# Patient Record
Sex: Female | Born: 1945 | Race: White | Hispanic: No | Smoking: Former smoker
Health system: Southern US, Community
[De-identification: ages and names within clinical notes are randomized; demographics above are authoritative.]

## PROBLEM LIST (undated history)

## (undated) DIAGNOSIS — B958 Unspecified staphylococcus as the cause of diseases classified elsewhere: Principal | ICD-10-CM

## (undated) DIAGNOSIS — R251 Tremor, unspecified: Secondary | ICD-10-CM

## (undated) DIAGNOSIS — E039 Hypothyroidism, unspecified: Secondary | ICD-10-CM

## (undated) DIAGNOSIS — Z124 Encounter for screening for malignant neoplasm of cervix: Principal | ICD-10-CM

## (undated) DIAGNOSIS — R0600 Dyspnea, unspecified: Principal | ICD-10-CM

## (undated) DIAGNOSIS — J069 Acute upper respiratory infection, unspecified: Secondary | ICD-10-CM

## (undated) DIAGNOSIS — F419 Anxiety disorder, unspecified: Secondary | ICD-10-CM

## (undated) DIAGNOSIS — E079 Disorder of thyroid, unspecified: Secondary | ICD-10-CM

## (undated) DIAGNOSIS — L309 Dermatitis, unspecified: Secondary | ICD-10-CM

## (undated) DIAGNOSIS — R1032 Left lower quadrant pain: Secondary | ICD-10-CM

## (undated) DIAGNOSIS — L578 Other skin changes due to chronic exposure to nonionizing radiation: Secondary | ICD-10-CM

## (undated) DIAGNOSIS — I10 Essential (primary) hypertension: Secondary | ICD-10-CM

## (undated) DIAGNOSIS — H109 Unspecified conjunctivitis: Secondary | ICD-10-CM

## (undated) DIAGNOSIS — Z780 Asymptomatic menopausal state: Secondary | ICD-10-CM

## (undated) DIAGNOSIS — Z0000 Encounter for general adult medical examination without abnormal findings: Secondary | ICD-10-CM

## (undated) DIAGNOSIS — N816 Rectocele: Secondary | ICD-10-CM

## (undated) DIAGNOSIS — E669 Obesity, unspecified: Secondary | ICD-10-CM

## (undated) DIAGNOSIS — F329 Major depressive disorder, single episode, unspecified: Secondary | ICD-10-CM

## (undated) DIAGNOSIS — E785 Hyperlipidemia, unspecified: Secondary | ICD-10-CM

## (undated) HISTORY — DX: Major depressive disorder, single episode, unspecified: F32.9

## (undated) HISTORY — DX: Asymptomatic menopausal state: Z78.0

## (undated) HISTORY — DX: Disorder of thyroid, unspecified: E07.9

## (undated) HISTORY — DX: Encounter for general adult medical examination without abnormal findings: Z00.00

## (undated) HISTORY — DX: Rectocele: N81.6

## (undated) HISTORY — DX: Hypothyroidism, unspecified: E03.9

## (undated) HISTORY — DX: Tremor, unspecified: R25.1

## (undated) HISTORY — DX: Essential (primary) hypertension: I10

## (undated) HISTORY — DX: Acute upper respiratory infection, unspecified: J06.9

## (undated) HISTORY — DX: Left lower quadrant pain: R10.32

## (undated) HISTORY — DX: Dyspnea, unspecified: R06.00

## (undated) HISTORY — DX: Anxiety disorder, unspecified: F41.9

## (undated) HISTORY — DX: Unspecified conjunctivitis: H10.9

## (undated) HISTORY — DX: Obesity, unspecified: E66.9

## (undated) HISTORY — DX: Encounter for screening for malignant neoplasm of cervix: Z12.4

## (undated) HISTORY — DX: Other skin changes due to chronic exposure to nonionizing radiation: L57.8

## (undated) HISTORY — DX: Hyperlipidemia, unspecified: E78.5

## (undated) HISTORY — DX: Unspecified staphylococcus as the cause of diseases classified elsewhere: B95.8

## (undated) HISTORY — DX: Dermatitis, unspecified: L30.9

---

## 1995-07-28 HISTORY — PX: ABDOMINAL HYSTERECTOMY: SHX81

## 1998-07-15 ENCOUNTER — Other Ambulatory Visit: Admission: RE | Admit: 1998-07-15 | Discharge: 1998-07-15 | Payer: Self-pay | Admitting: Obstetrics and Gynecology

## 2000-11-08 ENCOUNTER — Encounter: Payer: Self-pay | Admitting: Obstetrics and Gynecology

## 2000-11-08 ENCOUNTER — Other Ambulatory Visit: Admission: RE | Admit: 2000-11-08 | Discharge: 2000-11-08 | Payer: Self-pay | Admitting: Obstetrics and Gynecology

## 2000-11-08 ENCOUNTER — Encounter: Admission: RE | Admit: 2000-11-08 | Discharge: 2000-11-08 | Payer: Self-pay | Admitting: Obstetrics and Gynecology

## 2002-01-02 ENCOUNTER — Encounter: Admission: RE | Admit: 2002-01-02 | Discharge: 2002-01-02 | Payer: Self-pay | Admitting: Obstetrics and Gynecology

## 2002-01-02 ENCOUNTER — Encounter: Payer: Self-pay | Admitting: Obstetrics and Gynecology

## 2003-01-15 ENCOUNTER — Encounter: Payer: Self-pay | Admitting: Obstetrics and Gynecology

## 2003-01-15 ENCOUNTER — Encounter: Admission: RE | Admit: 2003-01-15 | Discharge: 2003-01-15 | Payer: Self-pay | Admitting: Obstetrics and Gynecology

## 2006-04-27 ENCOUNTER — Ambulatory Visit (HOSPITAL_BASED_OUTPATIENT_CLINIC_OR_DEPARTMENT_OTHER): Admission: RE | Admit: 2006-04-27 | Discharge: 2006-04-27 | Payer: Self-pay | Admitting: Orthopedic Surgery

## 2006-08-23 HISTORY — PX: CARPAL TUNNEL RELEASE: SHX101

## 2006-08-24 ENCOUNTER — Ambulatory Visit (HOSPITAL_BASED_OUTPATIENT_CLINIC_OR_DEPARTMENT_OTHER): Admission: RE | Admit: 2006-08-24 | Discharge: 2006-08-24 | Payer: Self-pay | Admitting: Orthopedic Surgery

## 2006-09-02 ENCOUNTER — Encounter: Admission: RE | Admit: 2006-09-02 | Discharge: 2006-09-02 | Payer: Self-pay | Admitting: Obstetrics and Gynecology

## 2007-09-05 ENCOUNTER — Encounter: Admission: RE | Admit: 2007-09-05 | Discharge: 2007-09-05 | Payer: Self-pay | Admitting: Obstetrics and Gynecology

## 2008-10-24 ENCOUNTER — Encounter: Admission: RE | Admit: 2008-10-24 | Discharge: 2008-10-24 | Payer: Self-pay | Admitting: Obstetrics and Gynecology

## 2009-10-28 ENCOUNTER — Encounter: Admission: RE | Admit: 2009-10-28 | Discharge: 2009-10-28 | Payer: Self-pay | Admitting: Obstetrics and Gynecology

## 2010-11-27 ENCOUNTER — Encounter
Admission: RE | Admit: 2010-11-27 | Discharge: 2010-11-27 | Payer: Self-pay | Source: Home / Self Care | Attending: Obstetrics and Gynecology | Admitting: Obstetrics and Gynecology

## 2010-12-25 ENCOUNTER — Ambulatory Visit (INDEPENDENT_AMBULATORY_CARE_PROVIDER_SITE_OTHER): Payer: MEDICARE | Admitting: Internal Medicine

## 2010-12-25 DIAGNOSIS — E039 Hypothyroidism, unspecified: Secondary | ICD-10-CM

## 2010-12-25 DIAGNOSIS — I10 Essential (primary) hypertension: Secondary | ICD-10-CM

## 2011-03-10 ENCOUNTER — Encounter: Payer: Self-pay | Admitting: Family

## 2011-03-10 ENCOUNTER — Ambulatory Visit (INDEPENDENT_AMBULATORY_CARE_PROVIDER_SITE_OTHER): Payer: Medicare Other | Admitting: Family

## 2011-03-10 DIAGNOSIS — E119 Type 2 diabetes mellitus without complications: Secondary | ICD-10-CM

## 2011-03-10 DIAGNOSIS — I10 Essential (primary) hypertension: Secondary | ICD-10-CM

## 2011-03-10 DIAGNOSIS — Z780 Asymptomatic menopausal state: Secondary | ICD-10-CM

## 2011-03-10 DIAGNOSIS — E785 Hyperlipidemia, unspecified: Secondary | ICD-10-CM

## 2011-03-10 DIAGNOSIS — J329 Chronic sinusitis, unspecified: Secondary | ICD-10-CM

## 2011-03-10 DIAGNOSIS — N951 Menopausal and female climacteric states: Secondary | ICD-10-CM

## 2011-03-10 MED ORDER — CEFUROXIME AXETIL 500 MG PO TABS: 500.0000 mg | ORAL_TABLET | Freq: Two times a day (BID) | ORAL | Status: AC

## 2011-03-10 NOTE — Progress Notes (Signed)
Subjective:    Patient ID: Crystal Hodge, female    DOB: 1946-05-24, 64 y.o.   MRN: [id number]  HPI  Ms.  Tuff is a 65 yr old female who presents today to establish care.  She presents with complaint of yellow/green nasal congestion which stared 4 days ago.  She has had a fever as high as 100.7.  Has been sleeping more.  Denies facial pain.  + associated cough.  Thick green sputum.   DM2- This is being managed by Dr. Lucianne Muss.  Pt reports that her diabetes has been well controlled.  Trying to lose weight.  HTN- This has been stable.   High Cholesterol- takes lipitor.  Denies associated muscle pain.  Review of Systems  Constitutional: Positive for fever. Negative for unexpected weight change.  HENT: Positive for sore throat and postnasal drip. Negative for ear pain and facial swelling.        Mild sore throat. Mouth is dry.  Eyes: Negative for itching.  Respiratory: Negative for shortness of breath and wheezing.   Cardiovascular: Negative for chest pain and leg swelling.  Gastrointestinal: Positive for nausea. Negative for vomiting and diarrhea.       Notes + nausea in AM's which she associates with medication.  Genitourinary: Negative for dysuria, urgency and frequency.  Musculoskeletal:       Occasional knee pain- seen by Dr. Despina Hick  Skin: Negative for rash.  Neurological: Negative for weakness and numbness.  Hematological: Negative for adenopathy.   Past Medical History  Diagnosis Date  . Diabetes mellitus     type II  . Hypertension   . Hyperlipidemia   . Thyroid disease     History   Social History  . Marital Status: Widowed    Spouse Name: N/A    Number of Children: 2  . Years of Education: N/A   Occupational History  . Retired    Social History Main Topics  . Smoking status: Former Smoker    Quit date: 11/23/1990  . Smokeless tobacco: Never Used  . Alcohol Use: Yes     occasional  . Drug Use: Not on file  . Sexually Active: Not on file   Other Topics  Concern  . Not on file   Social History Narrative   Caffeine: 1 mug coffee dailyRegular exercise: plays tennis.    Past Surgical History  Procedure Date  . Abdominal hysterectomy 07/28/95    Family History  Problem Relation Age of Onset  . Hyperlipidemia Mother   . Hyperlipidemia Father   . Heart disease Father     triple bypass  . Diabetes Son     ?type I    Allergies  Allergen Reactions  . Amoxicillin Diarrhea    No current outpatient prescriptions on file prior to visit.    BP 122/76  Pulse 66  Temp(Src) 98.4 F (36.9 C) (Oral)  Resp 18  Ht 5' 10.5" (1.791 m)  Wt 273 lb 1.3 oz (123.868 kg)  BMI 38.63 kg/m2       Objective:   Physical Exam  Constitutional: She is oriented to person, place, and time. She appears well-developed and well-nourished.  HENT:  Head: Normocephalic and atraumatic.  Right Ear: Tympanic membrane and ear canal normal.  Left Ear: Tympanic membrane and ear canal normal.  Mouth/Throat: Uvula is midline, oropharynx is clear and moist and mucous membranes are normal. No oropharyngeal exudate.  Eyes: Conjunctivae are normal. Pupils are equal, round, and reactive to light.  Neck: Normal  range of motion. Neck supple. No thyromegaly present.  Cardiovascular: Normal rate and regular rhythm.   Pulmonary/Chest: Effort normal and breath sounds normal.  Abdominal: Soft. She exhibits no distension. There is no tenderness.  Musculoskeletal: She exhibits no edema.  Lymphadenopathy:    She has no cervical adenopathy.  Neurological: She is alert and oriented to person, place, and time.  Skin: Skin is warm and dry. No rash noted.  Psychiatric: She has a normal mood and affect. Her behavior is normal. Judgment normal.          Assessment & Plan:

## 2011-03-10 NOTE — Patient Instructions (Signed)
Please call if your symptoms worsen or if they do not improve in 48-72 hours.

## 2011-03-11 DIAGNOSIS — I10 Essential (primary) hypertension: Secondary | ICD-10-CM | POA: Insufficient documentation

## 2011-03-11 DIAGNOSIS — E669 Obesity, unspecified: Secondary | ICD-10-CM | POA: Insufficient documentation

## 2011-03-11 DIAGNOSIS — J329 Chronic sinusitis, unspecified: Secondary | ICD-10-CM | POA: Insufficient documentation

## 2011-03-11 DIAGNOSIS — E1169 Type 2 diabetes mellitus with other specified complication: Secondary | ICD-10-CM | POA: Insufficient documentation

## 2011-03-11 DIAGNOSIS — E785 Hyperlipidemia, unspecified: Secondary | ICD-10-CM | POA: Insufficient documentation

## 2011-03-11 DIAGNOSIS — N951 Menopausal and female climacteric states: Secondary | ICD-10-CM | POA: Insufficient documentation

## 2011-03-11 NOTE — Assessment & Plan Note (Signed)
Symptoms consistent with sinusitis.  Will plan to treat with ceftin.

## 2011-03-11 NOTE — Assessment & Plan Note (Signed)
Appears stable 

## 2011-03-11 NOTE — Assessment & Plan Note (Signed)
She reports that this has been controlled and is being managed by Dr. Lucianne Muss.

## 2011-03-11 NOTE — Assessment & Plan Note (Signed)
She has followed with Dr. Rosalio Macadamia who has prescribed bioidentical hormones.  She is requesting a refill.  A hand written rx has been provided for 1 month supply +11 refills at pt's request.

## 2011-03-13 ENCOUNTER — Telehealth: Payer: Self-pay | Admitting: *Deleted

## 2011-03-13 NOTE — Telephone Encounter (Signed)
Pt states she has taken her antibiotic since Tuesday but doesn't feel any better; not feeling worse. Still having intermittent fever around 99.5. Advised pt per Melissa's instruction to give it one more day. If not feeling better tomorrow, go to the [facility]. Advised pt if symptoms worsen or difficulty breathing to go to the ER. Pt voices understanding.

## 2011-04-01 ENCOUNTER — Telehealth: Payer: Self-pay | Admitting: *Deleted

## 2011-04-01 NOTE — Telephone Encounter (Signed)
Records received from Dr Lonell Face office and forwarded to provider for review.

## 2011-04-02 ENCOUNTER — Telehealth: Payer: Self-pay | Admitting: Family

## 2011-04-02 NOTE — Telephone Encounter (Signed)
Left message on pt's voice mail to return my call

## 2011-04-02 NOTE — Telephone Encounter (Signed)
CEFTIN 500 MG TABLET 1 BY MOUTH TWICE DAILY LAST FILL 03-10-2011

## 2011-04-03 ENCOUNTER — Encounter: Payer: Self-pay | Admitting: Internal Medicine

## 2011-04-03 NOTE — Telephone Encounter (Signed)
Left message for pt to return my call.

## 2011-04-06 NOTE — Telephone Encounter (Signed)
Spoke to pt and she states she did not request a refill on Ceftin however she does not feel like her cough has improved. Feels like it may be worse, sometimes feels difficult to take a deep breath. Pt scheduled appt for tomorrow at 2:30, declines earlier appt.

## 2011-04-07 ENCOUNTER — Ambulatory Visit (HOSPITAL_BASED_OUTPATIENT_CLINIC_OR_DEPARTMENT_OTHER)
Admission: RE | Admit: 2011-04-07 | Discharge: 2011-04-07 | Disposition: A | Discharge status: Home / Self Care | Payer: Medicare Other | Source: Ambulatory Visit | Attending: Family | Admitting: Family

## 2011-04-07 ENCOUNTER — Ambulatory Visit (INDEPENDENT_AMBULATORY_CARE_PROVIDER_SITE_OTHER): Payer: Medicare Other | Admitting: Family

## 2011-04-07 ENCOUNTER — Encounter: Payer: Self-pay | Admitting: Family

## 2011-04-07 ENCOUNTER — Telehealth: Payer: Self-pay | Admitting: Family

## 2011-04-07 VITALS — BP 126/82 | HR 78 | Temp 98.3°F | Resp 18 | Ht 70.5 in

## 2011-04-07 DIAGNOSIS — R259 Unspecified abnormal involuntary movements: Secondary | ICD-10-CM

## 2011-04-07 DIAGNOSIS — R05 Cough: Secondary | ICD-10-CM

## 2011-04-07 DIAGNOSIS — R059 Cough, unspecified: Secondary | ICD-10-CM

## 2011-04-07 DIAGNOSIS — R509 Fever, unspecified: Secondary | ICD-10-CM

## 2011-04-07 DIAGNOSIS — R0789 Other chest pain: Secondary | ICD-10-CM

## 2011-04-07 DIAGNOSIS — R079 Chest pain, unspecified: Secondary | ICD-10-CM | POA: Insufficient documentation

## 2011-04-07 DIAGNOSIS — J40 Bronchitis, not specified as acute or chronic: Secondary | ICD-10-CM

## 2011-04-07 DIAGNOSIS — R251 Tremor, unspecified: Secondary | ICD-10-CM | POA: Insufficient documentation

## 2011-04-07 DIAGNOSIS — R0989 Other specified symptoms and signs involving the circulatory and respiratory systems: Secondary | ICD-10-CM

## 2011-04-07 HISTORY — DX: Tremor, unspecified: R25.1

## 2011-04-07 MED ORDER — BENZONATATE 100 MG PO CAPS: 100.0000 mg | ORAL_CAPSULE | Freq: Four times a day (QID) | ORAL | Status: DC | PRN

## 2011-04-07 MED ORDER — AZITHROMYCIN 250 MG PO TABS: ORAL_TABLET | ORAL | Status: AC

## 2011-04-07 NOTE — Telephone Encounter (Signed)
Called pt, reviewed x-ray results.  Will plan to treat for bronchitis with tessalon and zithromax. Pt verbalizes understanding.

## 2011-04-07 NOTE — Patient Instructions (Signed)
Please complete your chest x-ray on the first floor.  We will call you with results.

## 2011-04-07 NOTE — Assessment & Plan Note (Signed)
cxr performed today- negative for pneumonia.  Will plan to treat for bronchitis with zithromax and add tessalon PRN cough.

## 2011-04-07 NOTE — Assessment & Plan Note (Addendum)
Will refer to neurology for further evaluation.  Will also ask them to evaluate her HS tongue biting.  Nocturnal seizures are in the differential.

## 2011-04-07 NOTE — Progress Notes (Signed)
Subjective:    Patient ID: Crystal Hodge, female    DOB: 1946/06/11, 65 y.o.   MRN: [id number]  HPI  Crystal Hodge is a 65 yr old female who presents today with chief complaint of cough.   1. Cough-Crystal Hodge has had a low grade temp 99.8.  Cough is productive at times in the AM- but has become more dry.  Has a chest "heaviness."  Denies associated ear pain, sore throat, or nasal congestion.    2. Tongue Biting- Crystal Hodge reports that Crystal Hodge has woken up several times with severe bites on her tongue which Crystal Hodge does not remember doing.  Crystal Hodge sleeps alone. Denies associated urinary incontinence.  3. Head tremor- Crystal Hodge reports that her friends have been commenting on her "bobble head."  Crystal Hodge reports that this has been happening for over a year.  Crystal Hodge notes that on several occasions Crystal Hodge has had a right hand tremor which has made it difficult for her to hold things such as a computer mouse.  Review of Systems  See HPI  Past Medical History  Diagnosis Date  . Diabetes mellitus     type II  . Hypertension   . Hyperlipidemia   . Thyroid disease   . Hypothyroidism   . Rectocele   . Menopause   . Obesity     History   Social History  . Marital Status: Widowed    Spouse Name: N/A    Number of Children: 2  . Years of Education: HS   Occupational History  . Retired    Social History Main Topics  . Smoking status: Former Smoker    Quit date: 11/23/1990  . Smokeless tobacco: Never Used  . Alcohol Use: Yes     occasional  . Drug Use: No  . Sexually Active: Not Currently -- Female partner(s)   Other Topics Concern  . Not on file   Social History Narrative   Caffeine: 1 mug coffee dailyRegular exercise: plays tennis.    Past Surgical History  Procedure Date  . Abdominal hysterectomy 07/28/95  . Carpal tunnel release 10/07    Right    Family History  Problem Relation Age of Onset  . Hyperlipidemia Mother   . Hyperlipidemia Father   . Heart disease Father     triple bypass  . Diabetes Son    ?type I    Allergies  Allergen Reactions  . Amoxicillin Diarrhea    Current Outpatient Prescriptions on File Prior to Visit  Medication Sig Dispense Refill  . aspirin 81 MG tablet Take 81 mg by mouth every morning.        Marland Kitchen atorvastatin (LIPITOR) 40 MG tablet Take 40 mg by mouth every evening.        . benazepril (LOTENSIN) 10 MG tablet Take 10 mg by mouth daily.        . Coenzyme Q10 (CO Q 10) 10 MG CAPS Take 100 mg by mouth every morning.        . docusate sodium (COLACE) 100 MG capsule Take 100 mg by mouth 2 (two) times daily.        . fenofibrate (TRICOR) 145 MG tablet Not sure of strength.       . Fiber CAPS Take 1 capsule by mouth every morning.        Marland Kitchen glimepiride (AMARYL) 1 MG tablet Take 1 mg by mouth daily before supper.        . hydrochlorothiazide 25 MG tablet Take 1/2 tablet by mouth  every morning.       Marland Kitchen levothyroxine (SYNTHROID, LEVOTHROID) 100 MCG tablet Take 100 mcg by mouth daily.        . NON FORMULARY Apply 0.5-1 mLs topically daily. BIEST 6/4 (estriol/estradiol) / Progesterone/Testosterone HRT (10mL) 1-40-1mg /mL cream.       . NON FORMULARY 1 tsp honey with Manuka every morning.       . sitaGLIPtan-metformin (JANUMET) 50-1000 MG per tablet Take 1 tablet by mouth 2 (two) times daily with a meal.          BP 126/82  Pulse 78  Temp(Src) 98.3 F (36.8 C) (Oral)  Resp 18  Ht 5' 10.5" (1.791 m)  SpO2 97%       Objective:   Physical Exam  Constitutional: Crystal Hodge appears well-developed and well-nourished. No distress.  HENT:  Head: Normocephalic.  Right Ear: Tympanic membrane normal.  Left Ear: Tympanic membrane normal.  Eyes: Conjunctivae are normal. Pupils are equal, round, and reactive to light.  Neck: Normal range of motion. Neck supple.  Cardiovascular: Normal rate and regular rhythm.   Pulmonary/Chest: Effort normal and breath sounds normal. No respiratory distress.  Skin: Crystal Hodge is not diaphoretic.  Psychiatric: Crystal Hodge has a normal mood and affect. Her  behavior is normal. Judgment and thought content normal.  Neuro: subtle head tremor is noted.  ? Subtle voice tremor.         Assessment & Plan:

## 2011-04-10 NOTE — Op Note (Signed)
NAMECHARLEY, Crystal Hodge                 ACCOUNT NO.:  [id number]   MEDICAL RECORD NO.:  [id number]          PATIENT TYPE:  AMB   LOCATION:  DSC                          FACILITY:  [facility]   PHYSICIAN:  Katy Fitch. Sypher, M.D. DATE OF BIRTH:  02-21-1946   DATE OF PROCEDURE:  04/27/2006  DATE OF DISCHARGE:                                 OPERATIVE REPORT   PREOPERATIVE DIAGNOSIS:  1.  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  2.  Left carpal tunnel syndrome, both documented by electrodiagnostics      studies in October 2006.   POSTOPERATIVE DIAGNOSIS:  1.  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  2.  Left carpal tunnel syndrome, both documented by electrodiagnostics      studies in October 2006.   OPERATION:  1.  Decompression of left ulnar nerve at the cubital tunnel with resection      of the medial triceps tendon and neurolysis 6 cm proximal and 6 cm      distal to epicondyle.  2.  Decompression of left carpal canal by release of left transcarpal      ligament.   OPERATIONS:  Katy Fitch. Sypher, M.D.   ASSISTANT:  Marveen Reeks. Dasnoit, P.A.-C.   ANESTHESIA:  General by LMA.   SUPERVISING ANESTHESIOLOGIST:  Janetta Hora. Gelene Mink, M.D.   INDICATIONS:  Crystal Hodge is a 59-year woman referred through the courtesy  of Dr. Marjory Lies of [facility] for evaluation of left  hand weakness and numbness.  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  She  was developing weakness of her left hand and numbness in the ulnar  innervated fingers.  She had nocturnal numbness in her innervated fingers  bilaterally.  Clinical examination revealed borderline diabetes.  Electrodiagnostic completed by Dr. Johna Roles confirm bilateral carpal tunnel  syndrome and a severe left ulnar neuropathy.  I advised her in October 2006  to proceed with decompression of her ulnar nerve  and probable carpal tunnel  release.  She elected to postpone the surgery for more than six months  hoping that her symptoms would improve.  They did not and rather became more  troublesome.  She presents for evaluation and management at this time.   PROCEDURE:  Hannia Collet was brought to the operating room and placed in  supine position on the operating room table. Following the induction of  general anesthesia by LMA technique, the left arm was prepped with Betadine  soap solution and sterilely draped.  A pneumatic tourniquet was placed on  the proximal brachium.  Following exsanguination of the left arm with an  Esmarch bandage, the arterial tourniquet was inflated to 250 mmHg.  The  procedure commenced with a short incision in the line of the ring finger in  the palm.  The subcutaneous tissues were carefully divided in the palmar  fascia.  This was split longitudinally to the common branch of the median  nerve and superficial palmar arch.  The  median nerve was gently isolated  from the transcarpal ligament followed by the use of scissors to release the  transverse carpal ligament along its ulnar border extending it into the  distal forearm.  This widely opened the carpal canal.  The ulnar bursa was  noted be fibrotic.  No mass or other predicaments were noted.  The wound was  then inspected for bleeding points which were electrocauterized with bipolar  current followed by repair of the skin with intradermal 3-0 Prolene suture.   Attention was directed to the medial left elbow.  The landmarks were  palpated and a marking pen used to design an incision posterior to the  medial condyle.  The skin was taken sharply followed by careful dissection  of the subcutaneous tissues take care to gently retract the posterior branch  of the medial antebrachial cutaneous nerve.  The fascia overlying the ulnar  nerve was incised posterior to the medial condyle and a thick cuff of  fibrotic tissue  released from the medial head of the triceps.  The nerve was  severely compressed proximal to the cubital tunnel.  A complete neurolysis  of the nerve was accomplished 6 cm above the medial epicondyle followed by  release of the fascia at the head of the flexor carpi ulnaris and resection  of the arcuate ligament.  A band of the triceps tendon measuring 1 cm in  width and length of approximately 2 cm was resected fully decompressing the  ulnar nerve at the cubital tunnel.  The wound was inspected for bleeding  points followed by inspection of the nerve through a range of motion 0 to  140 degrees.  The nerve was stable due to an intact mesoneurium.  Bleeding  points were cauterized with bipolar current followed by repair of the skin  with intradermal 3-0 Prolene suture.  A compressive dressing was applied  with sterile gauze, Tegaderm and Ace wrap.  Crystal Hodge' wrist was immobilized  with a volar plaster splint maintaining the wrist in 5 degrees of  dorsiflexion.      Katy Fitch Sypher, M.D.  Electronically Signed     RVS/MEDQ  D:  04/27/2006  T:  04/27/2006  Job:  [id number]   cc:   Marjory Lies, M.D.  Fax: [phone]

## 2011-04-10 NOTE — Op Note (Signed)
NAMESAMERA, MACY                 ACCOUNT NO.:  [id number]   MEDICAL RECORD NO.:  [id number]          PATIENT TYPE:  AMB   LOCATION:  DSC                          FACILITY:  [facility]   PHYSICIAN:  Katy Fitch. Sypher, M.D. DATE OF BIRTH:  08-13-1946   DATE OF PROCEDURE:  08/24/2006  DATE OF DISCHARGE:                                 OPERATIVE REPORT   PREOPERATIVE DIAGNOSIS:  Chronic entrapment neuropathy, right median nerve  at carpal tunnel.   POSTOPERATIVE DIAGNOSIS:  Chronic entrapment neuropathy, right median nerve  at carpal tunnel.   OPERATIONS:  Release of right transverse carpal ligament.   OPERATING SURGEON:  Josephine Igo, M.D.   ASSISTANT:  Annye Rusk, PA-C.   ANESTHESIA:  General by LMA, supervising anesthesiologist is Dr. Krista Blue.   INDICATIONS:  Crystal Hodge a 65 year old woman referred through the courtesy  of Dr. Marjory Lies of [facility] for evaluation and  management of numb right hand.   Clinical examination revealed signs of carpal tunnel syndrome.  Electrodiagnostic studies confirmed right median neuropathy.   Due to failure to respond to nonoperative measures, she is brought to the  operating at this time for release of right transverse carpal ligament.   PROCEDURE:  Crystal Hodge is brought to the operating room and placed in  supine position on the operating table.   Following the induction of general anesthesia by LMA technique, the right  arm was prepped with Betadine soap solution, sterilely draped.  A pneumatic  tourniquet was applied to the proximal right brachium.   Following exsanguination of the right arm with Esmarch bandage, the arterial  tourniquet was inflated to 220 mmHg.   The procedure commenced with a short incision in the line of the ring finger  in the palm.  The subcutaneous tissues were carefully divided revealing the  palmar fascia.  This was split longitudinally to reveal the common sensory  branch of the  median nerve.  The median nerve proper was identified, gently  isolated from the transverse carpal ligament and following creation of a  path with a [place] 4 elevator, the ulnar aspect of transverse carpal  ligament released subcutaneously into the distal forearm with scissors.   This widely opened carpal canal.   The median nerve was noted to be invested in fibrotic tenosynovium.  There  were no signs of mass or other anatomic predicament.   Bleeding points along the margin released ligament were electrocauterized  with bipolar current followed by repair of the skin with intradermal 3-0  Prolene suture.   A compressive dressing was applied with volar plaster splint maintaining the  wrist in 5 degrees dorsiflexion.   2% lidocaine was obtained along the wound margins for postoperative  analgesia.   For aftercare [name] was provided prescription for Percocet 5 mg 1 tablet  p.o. 4 to 6 hours p.r.n. pain 20 tablets without refill.      Katy Fitch Sypher, M.D.  Electronically Signed     RVS/MEDQ  D:  08/24/2006  T:  08/25/2006  Job:  [id number]   cc:   Kipp Brood  Doristine Counter, M.D.

## 2011-05-12 ENCOUNTER — Ambulatory Visit: Payer: MEDICARE | Admitting: Internal Medicine

## 2011-05-14 ENCOUNTER — Encounter: Payer: Self-pay | Admitting: Internal Medicine

## 2011-05-14 ENCOUNTER — Telehealth: Payer: Self-pay | Admitting: *Deleted

## 2011-05-14 ENCOUNTER — Ambulatory Visit (INDEPENDENT_AMBULATORY_CARE_PROVIDER_SITE_OTHER): Payer: Medicare Other | Admitting: Internal Medicine

## 2011-05-14 VITALS — BP 124/80 | Temp 98.6°F | Wt 244.0 lb

## 2011-05-14 DIAGNOSIS — R197 Diarrhea, unspecified: Secondary | ICD-10-CM

## 2011-05-14 MED ORDER — CIPROFLOXACIN HCL 500 MG PO TABS: 500.0000 mg | ORAL_TABLET | Freq: Two times a day (BID) | ORAL | Status: AC

## 2011-05-14 MED ORDER — DIPHENOXYLATE-ATROPINE 2.5-0.025 MG PO TABS: 1.0000 | ORAL_TABLET | Freq: Four times a day (QID) | ORAL | Status: DC | PRN

## 2011-05-14 NOTE — Telephone Encounter (Signed)
So if the diarrhea just started I would not treat with antibiotics or really without seeing the patient. We would of course be happy to see at [facility] to check her vitals and some lab work. For less than 24 hour of diarrhea without fever, blood or syncope would recommend clear liquid diet for 24 hours, make some of those beverages electrolyte substitutes such as Gatorade or Pedialyte and Ginger Ale can really help nausea. When she feels like eating she should start back with the BRAT diet (bananas, rice, applesauce, toast) or similar foods such as plain pasta, rice etc. Then progress diet as tolerated over 24 hours. If not improving should be seen on Friday. If worse, severe pain or other symptoms develop she needs to seek immediate medical care

## 2011-05-14 NOTE — Telephone Encounter (Signed)
Call placed to patient at [phone], she was advised per Dr. Abner Greenspan instructions. Patient states that she has had diarrhea an additional 7 times since her call this am, and feels she will need to be seen. She states she has scheduled a appointment for 1:15 pm today

## 2011-05-14 NOTE — Telephone Encounter (Signed)
Patient called and left voice message stating she is having a lot of "runs", and has been up and down all night. She c/o increased amount of belching.  Call returned to patient at [phone], she states that she is having watery stools brown in color that started Monday night, yesterday she thought she was getting better, but started again last night. She denies vomiting, but she is having severe nausea. She denies temperature, and does not feel feverish. She states she had a similar incident and was advised she needed to be treated with antibiotics. She states since last night she has a minimum of 10 bowels movements. No self care measures have been taken by patient

## 2011-05-15 ENCOUNTER — Telehealth: Payer: Self-pay | Admitting: Internal Medicine

## 2011-05-15 DIAGNOSIS — R197 Diarrhea, unspecified: Secondary | ICD-10-CM | POA: Insufficient documentation

## 2011-05-15 LAB — CBC WITH DIFFERENTIAL/PLATELET
Eosinophils Relative: 4 % (ref 0–5)
HCT: 43.7 % (ref 36.0–46.0)
Hemoglobin: 13.4 g/dL (ref 12.0–15.0)
Lymphocytes Relative: 17 % (ref 12–46)
Lymphs Abs: 1.9 10*3/uL (ref 0.7–4.0)
MCV: 80.8 fL (ref 78.0–100.0)
Monocytes Absolute: 0.7 10*3/uL (ref 0.1–1.0)
Monocytes Relative: 6 % (ref 3–12)
Neutro Abs: 7.9 10*3/uL — ABNORMAL HIGH (ref 1.7–7.7)
WBC: 11 10*3/uL — ABNORMAL HIGH (ref 4.0–10.5)

## 2011-05-15 LAB — BASIC METABOLIC PANEL
BUN: 22 mg/dL (ref 6–23)
CO2: 23 mEq/L (ref 19–32)
Chloride: 101 mEq/L (ref 96–112)
Glucose, Bld: 128 mg/dL — ABNORMAL HIGH (ref 70–99)
Potassium: 4.4 mEq/L (ref 3.5–5.3)

## 2011-05-15 NOTE — Telephone Encounter (Signed)
Pt would like lab results from yesterday. 

## 2011-05-15 NOTE — Progress Notes (Signed)
  Subjective:    Patient ID: Crystal Hodge, female    DOB: [birth year], 65 y.o.   MRN: [id number]  HPI Pt presents to clinic for evaluation of diarrhea. Notes 4d h/o severe diarrhea with over 8 loose watery brown stools. Denies blood in stool, n/v, abdominal pain or sick exposures.  Attempted otc medication without significant improvement. Attempting to increase po fluid intake but takes diuretic. No dizziness, presyncope or syncope. Hemodynamically stable. Sx's initially improved but last 24 hours have worsened. No other alleviating or exacerbating factors. No other complaints.  Reviewed pmh, medications and allergies.    Review of Systems see HPI     Objective:   Physical Exam  Nursing note and vitals reviewed. Constitutional: She appears well-developed and well-nourished. No distress.  HENT:  Head: Normocephalic and atraumatic.  Right Ear: External ear normal.  Left Ear: External ear normal.  Nose: Nose normal.  Eyes: Conjunctivae are normal. No scleral icterus.  Neck: Neck supple.  Abdominal: Soft. Bowel sounds are normal. She exhibits no distension and no mass. There is no tenderness. There is no rebound and no guarding.  Neurological: She is alert.  Skin: Skin is warm and dry. No rash noted. She is not diaphoretic. No erythema.  Psychiatric: She has a normal mood and affect.          Assessment & Plan:

## 2011-05-18 NOTE — Telephone Encounter (Signed)
Patient called and left voice message stating she was returning the phone call,and that she was at home and could be reached at [phone]. Call was returned to patient at [phone], she was informed of lab results per Dr. Rodena Medin instruction

## 2011-05-18 NOTE — Telephone Encounter (Signed)
pls notify chem7 nl. Slightly high wbc but should be on abx   Call placed to patient at [phone], no answer. A voice message was left for patient to return call regarding lab results. (antibiotic has been prescribed)

## 2011-08-15 ENCOUNTER — Ambulatory Visit (INDEPENDENT_AMBULATORY_CARE_PROVIDER_SITE_OTHER): Payer: Medicare Other | Admitting: Family Medicine

## 2011-08-15 ENCOUNTER — Encounter: Payer: Self-pay | Admitting: Family Medicine

## 2011-08-15 VITALS — BP 112/72 | Temp 100.5°F

## 2011-08-15 DIAGNOSIS — J40 Bronchitis, not specified as acute or chronic: Secondary | ICD-10-CM

## 2011-08-15 MED ORDER — HYDROCODONE-HOMATROPINE 5-1.5 MG/5ML PO SYRP: 5.0000 mL | ORAL_SOLUTION | ORAL | Status: AC | PRN

## 2011-08-15 MED ORDER — AZITHROMYCIN 250 MG PO TABS: ORAL_TABLET | ORAL | Status: DC

## 2011-08-15 NOTE — Progress Notes (Signed)
  Subjective:    Patient ID: Crystal Hodge, female    DOB: [birth year], 65 y.o.   MRN: [id number]  HPI Here for 2 days of chest tightness, fever, and coughing up green sputum. Drinking fluids   Review of Systems  Constitutional: Negative.   HENT: Negative.   Eyes: Negative.   Respiratory: Positive for cough and wheezing.        Objective:   Physical Exam  Constitutional: She appears well-developed and well-nourished.  HENT:  Right Ear: External ear normal.  Left Ear: External ear normal.  Nose: Nose normal.  Mouth/Throat: Oropharynx is clear and moist. No oropharyngeal exudate.  Eyes: Conjunctivae are normal. Pupils are equal, round, and reactive to light.  Neck: Neck supple. No thyromegaly present.  Pulmonary/Chest: Effort normal. No respiratory distress. She has no wheezes. She has no rales.       Scattered rhonchi   Lymphadenopathy:    She has no cervical adenopathy.          Assessment & Plan:   recheck prn

## 2011-08-20 ENCOUNTER — Ambulatory Visit (INDEPENDENT_AMBULATORY_CARE_PROVIDER_SITE_OTHER): Payer: Medicare Other | Admitting: Internal Medicine

## 2011-08-20 ENCOUNTER — Encounter: Payer: Self-pay | Admitting: Internal Medicine

## 2011-08-20 ENCOUNTER — Ambulatory Visit (HOSPITAL_BASED_OUTPATIENT_CLINIC_OR_DEPARTMENT_OTHER)
Admission: RE | Admit: 2011-08-20 | Discharge: 2011-08-20 | Disposition: A | Discharge status: Home / Self Care | Payer: Medicare Other | Source: Ambulatory Visit | Attending: Internal Medicine | Admitting: Internal Medicine

## 2011-08-20 VITALS — BP 110/70 | HR 68 | Temp 98.4°F | Resp 20 | Ht 70.5 in

## 2011-08-20 DIAGNOSIS — R059 Cough, unspecified: Secondary | ICD-10-CM

## 2011-08-20 DIAGNOSIS — R05 Cough: Secondary | ICD-10-CM

## 2011-08-20 DIAGNOSIS — E119 Type 2 diabetes mellitus without complications: Secondary | ICD-10-CM

## 2011-08-20 MED ORDER — METHYLPREDNISOLONE ACETATE 40 MG/ML IJ SUSP
40.0000 mg | Freq: Once | INTRAMUSCULAR | Status: AC
  Administered 2011-08-20: 40 mg via INTRAMUSCULAR

## 2011-08-20 MED ORDER — LEVOFLOXACIN 500 MG PO TABS: 500.0000 mg | ORAL_TABLET | Freq: Every day | ORAL | Status: AC

## 2011-08-20 NOTE — Assessment & Plan Note (Signed)
Obtain cxr pa/lat. Begin levaquin course. depomedrol 40mg  im given. Followup if no improvement or worsening.

## 2011-08-20 NOTE — Progress Notes (Signed)
  Subjective:    Patient ID: Crystal Hodge, female    DOB: February 25, 1946, 65 y.o.   MRN: [id number]  HPI Pt presents to clinic for evaluation of cough. Notes 7-8 day h/o NP cough, chest and head congestion with initial fever tmax 101.5 last week now resolved. Seen 9/22 by Dr. Clent Ridges and given zpak and hydromet. Completed abx and initially improved but now worsened. No dyspnea or wheezing. Cough worse at night. No other alleviating or exacerbating factors. Has dm and states is only checking fsbs periodically. No other complaints.  Past Medical History  Diagnosis Date  . Diabetes mellitus     type II  . Hypertension   . Hyperlipidemia   . Thyroid disease   . Hypothyroidism   . Rectocele   . Menopause   . Obesity   . Tremor 04/07/2011   Past Surgical History  Procedure Date  . Abdominal hysterectomy 07/28/95  . Carpal tunnel release 10/07    Right    reports that she quit smoking about 20 years ago. She has never used smokeless tobacco. She reports that she drinks alcohol. She reports that she does not use illicit drugs. family history includes Diabetes in her son; Heart disease in her father; and Hyperlipidemia in her father and mother. Allergies  Allergen Reactions  . Amoxicillin Diarrhea       Review of Systems see hpi     Objective:   Physical Exam  Nursing note and vitals reviewed. Constitutional: She appears well-developed and well-nourished.  HENT:  Head: Normocephalic and atraumatic.  Right Ear: Tympanic membrane, external ear and ear canal normal.  Left Ear: Tympanic membrane, external ear and ear canal normal.  Nose: Nose normal.  Mouth/Throat: Uvula is midline, oropharynx is clear and moist and mucous membranes are normal. No oropharyngeal exudate.  Eyes: Conjunctivae are normal. Right eye exhibits no discharge. Left eye exhibits no discharge. No scleral icterus.  Neck: Neck supple.  Cardiovascular: Normal rate, regular rhythm and normal heart sounds.  Exam reveals no  gallop and no friction rub.   No murmur heard. Pulmonary/Chest: Effort normal and breath sounds normal. No respiratory distress. She has no wheezes. She has no rales.  Lymphadenopathy:    She has no cervical adenopathy.  Neurological: She is alert.  Skin: Skin is warm and dry.  Psychiatric: She has a normal mood and affect.          Assessment & Plan:

## 2011-08-20 NOTE — Assessment & Plan Note (Signed)
Given recent infxn and beginning FQ abx recommend regular daily fsbs monitoring.

## 2011-10-02 ENCOUNTER — Telehealth: Payer: Self-pay | Admitting: Internal Medicine

## 2011-10-02 MED ORDER — HORMONE CREAM BASE CREA: TOPICAL_CREAM | Status: DC

## 2011-10-02 NOTE — Telephone Encounter (Signed)
Rx refill sent to pharmacy per Dr Rodena Medin approval.

## 2011-10-02 NOTE — Telephone Encounter (Signed)
Refill hormone cream

## 2011-11-26 ENCOUNTER — Other Ambulatory Visit: Payer: Self-pay | Admitting: Internal Medicine

## 2011-11-26 DIAGNOSIS — Z1231 Encounter for screening mammogram for malignant neoplasm of breast: Secondary | ICD-10-CM

## 2011-11-27 ENCOUNTER — Telehealth: Payer: Self-pay | Admitting: *Deleted

## 2011-11-27 NOTE — Telephone Encounter (Signed)
Call placed to patient at [phone], she was informed per Dr Rodena Medin instructions.

## 2011-11-27 NOTE — Telephone Encounter (Signed)
Things to monitor for- changes in vision, severe ha's, stroke like sxs (weakness of extremity, numbness, difficulty with speech) or difficulty in moving eyes in different directions. Any of those sx's would prompt and ED visit immediately. Otherwise she can monitor the area or let me take a look at it if she wants

## 2011-11-27 NOTE — Telephone Encounter (Signed)
Patient stated the she hit herself with a tennis racket on Tuesday in head/ face to the left of the tip of her eyebrow to scalp, she stated that it "blew up", and she immediately iced it with relief. She now has bruising at the site. She states that she "feels a little strange". She has a little bit of pain at the site of injury, and she is tired. She states she is not sure if she is getting sick, or if there is something else going on. She would like to know what Dr Rodena Medin advises.

## 2011-11-30 ENCOUNTER — Ambulatory Visit (HOSPITAL_BASED_OUTPATIENT_CLINIC_OR_DEPARTMENT_OTHER)
Admission: RE | Admit: 2011-11-30 | Discharge: 2011-11-30 | Disposition: A | Discharge status: Home / Self Care | Payer: Medicare Other | Source: Ambulatory Visit | Attending: Internal Medicine | Admitting: Internal Medicine

## 2011-11-30 DIAGNOSIS — Z1231 Encounter for screening mammogram for malignant neoplasm of breast: Secondary | ICD-10-CM | POA: Insufficient documentation

## 2012-01-11 ENCOUNTER — Telehealth: Payer: Self-pay | Admitting: *Deleted

## 2012-01-11 NOTE — Telephone Encounter (Signed)
[facility] Triage Call Report Triage Record Num: [id number] Operator: Audelia Hives Patient Name: [facility] Call Date & Time: 01/10/2012 12:34:38PM Patient Phone: [phone] PCP: Marguarite Arbour Patient Gender: Female PCP Fax : [phone] Patient DOB: 01-Aug-1946 Practice Name: [facility] Reason for Call: Caller: Zoriyah/Patient; PCP: Marguarite Arbour; CB#: [phone]; Call regarding Cough/Congestion; Pt calling regarding possible bronchitis, current temp 100.5 orally. Has chest congestion, just got off of cruise ship and in [place], will not be home until 2/22. Also has cough, s/s started 01/09/12. Emergent s/s for Upper Respiratory Infection r/o per protocol except for call provider immediately due to any temp elevation in an immunocomprised individual. Per Dr. Para March advised to proceed to UC for eval, no abx treatment. Protocol(s) Used: Upper Respiratory Infection (URI) Recommended Outcome per Protocol: Call Provider Immediately Reason for Outcome: Any temperature elevation in an immunocompromised individual OR frail elderly Care Advice: ~ Go to the ED if has new onset of stiff neck, vomiting, drowsiness or confusion. Drink more fluids -- water, low-sugar juices, tea and warm soup, especially chicken broth, are options. Avoid caffeinated or alcoholic beverages because they can increase the chance of dehydration. ~ ~ SYMPTOM / CONDITION MANAGEMENT 01/10/2012 12:53:39PM Page 1 of 1 [id number]

## 2012-01-16 ENCOUNTER — Ambulatory Visit (INDEPENDENT_AMBULATORY_CARE_PROVIDER_SITE_OTHER): Payer: Medicare Other | Admitting: Internal Medicine

## 2012-01-16 ENCOUNTER — Encounter: Payer: Self-pay | Admitting: Internal Medicine

## 2012-01-16 VITALS — BP 118/78 | HR 76 | Temp 98.5°F

## 2012-01-16 DIAGNOSIS — J209 Acute bronchitis, unspecified: Secondary | ICD-10-CM | POA: Insufficient documentation

## 2012-01-16 MED ORDER — CEFUROXIME AXETIL 250 MG PO TABS: 250.0000 mg | ORAL_TABLET | Freq: Two times a day (BID) | ORAL | Status: AC

## 2012-01-16 MED ORDER — HYDROCODONE-HOMATROPINE 5-1.5 MG/5ML PO SYRP: 5.0000 mL | ORAL_SOLUTION | Freq: Every evening | ORAL | Status: AC | PRN

## 2012-01-16 NOTE — Progress Notes (Signed)
Subjective:    Patient ID: Crystal Hodge, female    DOB: [birth year], 66 y.o.   MRN: [id number]  HPI Has been sick for a week Seen at urgent care 7 days ago---seen in [place] Got z-pak but didn't improve  This is different than what she gets every year Feels she got it on cruise ship  Sore throat yesterday Started with cough and some chest discomfort with cough Then seemed more in head Some nasal discharge and PND  Cough has been persistent Fever over 101 for an entire day 4-5 days ago  No SOB Current Outpatient Prescriptions on File Prior to Visit  Medication Sig Dispense Refill  . aspirin 81 MG tablet Take 81 mg by mouth every morning.        . benazepril (LOTENSIN) 10 MG tablet Take 10 mg by mouth daily.        . Coenzyme Q10 (CO Q 10) 10 MG CAPS Take 100 mg by mouth every morning.        . docusate sodium (COLACE) 100 MG capsule Take 100 mg by mouth 2 (two) times daily.        . Fiber CAPS Take 1 capsule by mouth every morning.        Marland Kitchen glimepiride (AMARYL) 1 MG tablet Take 1 mg by mouth daily before supper.        . Hormone Cream Base CREA Apply 0.5- 1 ml topical daily. BEIST 6/4- (estriol/estradiol)/Progesterone/Testosterone HRT (10 ml) 1-40-1 mg/ml cream  160 g  5  . levothyroxine (SYNTHROID, LEVOTHROID) 100 MCG tablet Take 100 mcg by mouth daily.        . NON FORMULARY 1 tsp honey with Manuka every morning.       . sitaGLIPtan-metformin (JANUMET) 50-1000 MG per tablet Take 1 tablet by mouth 2 (two) times daily with a meal.        . benzonatate (TESSALON PERLES) 100 MG capsule Take 1 capsule (100 mg total) by mouth every 6 (six) hours as needed for cough.  30 capsule  0    Allergies  Allergen Reactions  . Amoxicillin Diarrhea    Past Medical History  Diagnosis Date  . Diabetes mellitus     type II  . Hypertension   . Hyperlipidemia   . Thyroid disease   . Hypothyroidism   . Rectocele   . Menopause   . Obesity   . Tremor 04/07/2011    Past Surgical History    Procedure Date  . Abdominal hysterectomy 07/28/95  . Carpal tunnel release 10/07    Right    Family History  Problem Relation Age of Onset  . Hyperlipidemia Mother   . Hyperlipidemia Father   . Heart disease Father     triple bypass  . Diabetes Son     ?type I    History   Social History  . Marital Status: Widowed    Spouse Name: N/A    Number of Children: 2  . Years of Education: HS   Occupational History  . Retired    Social History Main Topics  . Smoking status: Former Smoker    Quit date: 11/23/1990  . Smokeless tobacco: Never Used  . Alcohol Use: Yes     occasional  . Drug Use: No  . Sexually Active: Not Currently -- Female partner(s)   Other Topics Concern  . Not on file   Social History Narrative   Caffeine: 1 mug coffee dailyRegular exercise: plays tennis.   Review  of Systems No rash No vomiting. Slight loose stools Appetite fair    Objective:   Physical Exam  Constitutional: She appears well-developed and well-nourished. No distress.  HENT:  Mouth/Throat: Oropharynx is clear and moist. No oropharyngeal exudate.       Mild nasal swelling TMs normal  Neck: Normal range of motion. Neck supple.  Pulmonary/Chest: Effort normal and breath sounds normal. No respiratory distress. She has no wheezes. She has no rales.  Lymphadenopathy:    She has no cervical adenopathy.          Assessment & Plan:

## 2012-01-16 NOTE — Assessment & Plan Note (Signed)
Really seemed to have been treated for viral infection in [place] Past asthmatic features but no sig wheeze now Prednisone in past but I don't think she needs it now  Discussed that it is not clear that an antibiotic will help Will give written Rx for ceftin to start if not improving in the next couple of days

## 2012-01-16 NOTE — Patient Instructions (Signed)
Please start the antibiotic if you are not improving in the next few days

## 2012-01-27 ENCOUNTER — Telehealth: Payer: Self-pay | Admitting: Family

## 2012-01-27 ENCOUNTER — Ambulatory Visit (HOSPITAL_BASED_OUTPATIENT_CLINIC_OR_DEPARTMENT_OTHER)
Admission: RE | Admit: 2012-01-27 | Discharge: 2012-01-27 | Disposition: A | Discharge status: Home / Self Care | Payer: Medicare Other | Source: Ambulatory Visit | Attending: Family | Admitting: Family

## 2012-01-27 ENCOUNTER — Ambulatory Visit (INDEPENDENT_AMBULATORY_CARE_PROVIDER_SITE_OTHER): Payer: Medicare Other | Admitting: Family

## 2012-01-27 ENCOUNTER — Encounter: Payer: Self-pay | Admitting: Family

## 2012-01-27 VITALS — BP 120/72 | HR 73 | Temp 98.5°F | Resp 16

## 2012-01-27 DIAGNOSIS — IMO0001 Reserved for inherently not codable concepts without codable children: Secondary | ICD-10-CM

## 2012-01-27 DIAGNOSIS — M791 Myalgia, unspecified site: Secondary | ICD-10-CM

## 2012-01-27 DIAGNOSIS — R05 Cough: Secondary | ICD-10-CM

## 2012-01-27 DIAGNOSIS — R059 Cough, unspecified: Secondary | ICD-10-CM | POA: Insufficient documentation

## 2012-01-27 DIAGNOSIS — E039 Hypothyroidism, unspecified: Secondary | ICD-10-CM

## 2012-01-27 MED ORDER — BENZONATATE 100 MG PO CAPS: 100.0000 mg | ORAL_CAPSULE | Freq: Three times a day (TID) | ORAL | Status: AC | PRN

## 2012-01-27 NOTE — Progress Notes (Signed)
Subjective:    Patient ID: Crystal Hodge, female    DOB: 05-07-1946, 66 y.o.   MRN: [id number]  HPI  Ms.  Hodge is a 66 yr old female who presents today with chief complaint of dry cough. She reports that her symptoms started about 3 weeks ago. 2/17  Reports that she got sick following a cruise.  She reports that she saw a Doctor on Sunday.  Went to a [facility] and she was treated with z-pak and tessalon perles.  She was then seen in [facility] 1 week later (saturday clinic).  She was treated with cough med at that time which has helped her to sleep.  Reports 99.5 temp recently.   Has some muscular pain around left anterior rib.  Reports poor energy.    She reports fatigue/tiredness even before this illness. Has muscle soreness.  She is concerned that "something else might be going on."   Review of Systems See HPI  Past Medical History  Diagnosis Date  . Diabetes mellitus     type II  . Hypertension   . Hyperlipidemia   . Thyroid disease   . Hypothyroidism   . Rectocele   . Menopause   . Obesity   . Tremor 04/07/2011    History   Social History  . Marital Status: Widowed    Spouse Name: N/A    Number of Children: 2  . Years of Education: HS   Occupational History  . Retired    Social History Main Topics  . Smoking status: Former Smoker    Quit date: 11/23/1990  . Smokeless tobacco: Never Used  . Alcohol Use: Yes     occasional  . Drug Use: No  . Sexually Active: Not Currently -- Female partner(s)   Other Topics Concern  . Not on file   Social History Narrative   Caffeine: 1 mug coffee dailyRegular exercise: plays tennis.    Past Surgical History  Procedure Date  . Abdominal hysterectomy 07/28/95  . Carpal tunnel release 10/07    Right    Family History  Problem Relation Age of Onset  . Hyperlipidemia Mother   . Hyperlipidemia Father   . Heart disease Father     triple bypass  . Diabetes Son     ?type I    Allergies  Allergen Reactions  .  Amoxicillin Diarrhea    Current Outpatient Prescriptions on File Prior to Visit  Medication Sig Dispense Refill  . aspirin 81 MG tablet Take 81 mg by mouth every morning.        . benazepril (LOTENSIN) 10 MG tablet Take 10 mg by mouth daily.        . Coenzyme Q10 (CO Q 10) 10 MG CAPS Take 100 mg by mouth every morning.        . docusate sodium (COLACE) 100 MG capsule Take 100 mg by mouth 2 (two) times daily.        . Fiber CAPS Take 1 capsule by mouth every morning.        Marland Kitchen glimepiride (AMARYL) 1 MG tablet Take 1 mg by mouth daily before supper.        . Hormone Cream Base CREA Apply 0.5- 1 ml topical daily. BEIST 6/4- (estriol/estradiol)/Progesterone/Testosterone HRT (10 ml) 1-40-1 mg/ml cream  160 g  5  . levothyroxine (SYNTHROID, LEVOTHROID) 100 MCG tablet Take 100 mcg by mouth daily.        . NON FORMULARY 1 tsp honey with Manuka  every morning.       . rosuvastatin (CRESTOR) 40 MG tablet Take 40 mg by mouth daily.      . sitaGLIPtan-metformin (JANUMET) 50-1000 MG per tablet Take 1 tablet by mouth 2 (two) times daily with a meal.        . triamterene-hydrochlorothiazide (DYAZIDE) 37.5-25 MG per capsule Take 1 capsule by mouth daily.        BP 120/72  Pulse 73  Temp(Src) 98.5 F (36.9 C) (Oral)  Resp 16  SpO2 97%       Objective:   Physical Exam  Constitutional: She appears well-developed and well-nourished. No distress.  HENT:  Head: Normocephalic and atraumatic.  Right Ear: Tympanic membrane and ear canal normal.  Left Ear: Tympanic membrane and ear canal normal.  Mouth/Throat: No oropharyngeal exudate, posterior oropharyngeal edema or posterior oropharyngeal erythema.  Cardiovascular: Normal rate and regular rhythm.   No murmur heard. Pulmonary/Chest: Effort normal and breath sounds normal. No respiratory distress. She has no wheezes. She has no rales. She exhibits no tenderness.  Psychiatric: She has a normal mood and affect. Her behavior is normal. Judgment and thought  content normal.          Assessment & Plan:

## 2012-01-27 NOTE — Patient Instructions (Addendum)
Please complete your chest x-ray on the first floor.  Call if symptoms worsen or if no improvement in the next few days.

## 2012-01-27 NOTE — Telephone Encounter (Signed)
Opened in error

## 2012-01-28 ENCOUNTER — Encounter (INDEPENDENT_AMBULATORY_CARE_PROVIDER_SITE_OTHER): Payer: Medicare Other | Admitting: Ophthalmology

## 2012-01-28 DIAGNOSIS — H2510 Age-related nuclear cataract, unspecified eye: Secondary | ICD-10-CM

## 2012-01-28 DIAGNOSIS — H43819 Vitreous degeneration, unspecified eye: Secondary | ICD-10-CM

## 2012-01-28 DIAGNOSIS — E1139 Type 2 diabetes mellitus with other diabetic ophthalmic complication: Secondary | ICD-10-CM

## 2012-01-28 DIAGNOSIS — M791 Myalgia, unspecified site: Secondary | ICD-10-CM | POA: Insufficient documentation

## 2012-01-28 DIAGNOSIS — E11319 Type 2 diabetes mellitus with unspecified diabetic retinopathy without macular edema: Secondary | ICD-10-CM

## 2012-01-28 NOTE — Assessment & Plan Note (Signed)
She is concerned re: autoimmune issue. Will obtain basic autoimmune work up.  Also discussed that statin could be playing a role. We discussed holding her crestor, but she tells me that she wishes to discuss this with her endocrinologist who is prescribing her statin.  Obtain CK.

## 2012-01-28 NOTE — Assessment & Plan Note (Addendum)
No clear indication at this time for further antibiotics.  Chest x-ray is clear. May be related to allergies.  I have recommended a trial of claritin 10mg  once daily.  She will contact us if symptoms worsen or do not improve. ACE cough is another possibility.

## 2012-02-01 ENCOUNTER — Telehealth: Payer: Self-pay | Admitting: Family

## 2012-02-01 NOTE — Telephone Encounter (Signed)
Left message on machine to return my call. 

## 2012-02-01 NOTE — Telephone Encounter (Signed)
I see that the patient has not completed lab work ordered last visit. Pls contact her and remind her to complete lab work at earliest convenience.

## 2012-02-02 NOTE — Telephone Encounter (Signed)
Pt called back and requested that I send lab order to Dr Remus Blake office as she was on her way to have labs drawn there. Tried to speak with laboratory at [facility] and received voicemail. Obtained fax # [phone] and faxed order. Advised pt to ask if they will be able to draw labs for our order and fax results to [place]. Pt will call me if they cannot draw our labs.

## 2012-02-02 NOTE — Telephone Encounter (Signed)
Spoke to pt, she states she was not aware that blood work had been ordered for her. States she was just on her way out the door for an endocrinology appt and will call me back.

## 2012-02-02 NOTE — Telephone Encounter (Signed)
Received call from [facility] at [facility] to clarify lab orders. Verbal given with dx codes. She states we should have results by Thursday.

## 2012-02-12 NOTE — Telephone Encounter (Signed)
Spoke to [place] at [phone], she will fax results.

## 2012-03-23 ENCOUNTER — Telehealth: Payer: Self-pay | Admitting: Internal Medicine

## 2012-03-23 MED ORDER — HORMONE CREAM BASE CREA: TOPICAL_CREAM | Status: DC

## 2012-03-23 NOTE — Telephone Encounter (Signed)
Ok to give 1 month supply with 5 refills.

## 2012-03-23 NOTE — Telephone Encounter (Signed)
Rx refill sent to pharmacy. 

## 2012-03-23 NOTE — Telephone Encounter (Signed)
Call placed to patient at [phone], no answer. A detailed voice message was left for patient to return phone call regarding compounded hormone.

## 2012-03-23 NOTE — Telephone Encounter (Signed)
Refill- biest 6/4(estriol/estradiol)/progesterone/testosterone.Apply 1/2 to 1ml topically once a day as directed. Qty 30 last fill 4.27.13

## 2012-09-05 ENCOUNTER — Encounter: Payer: Self-pay | Admitting: Internal Medicine

## 2012-09-05 ENCOUNTER — Ambulatory Visit (INDEPENDENT_AMBULATORY_CARE_PROVIDER_SITE_OTHER): Payer: Medicare Other | Admitting: Internal Medicine

## 2012-09-05 VITALS — BP 130/74 | HR 68 | Temp 98.3°F | Resp 16 | Wt 235.2 lb

## 2012-09-05 DIAGNOSIS — R251 Tremor, unspecified: Secondary | ICD-10-CM

## 2012-09-05 DIAGNOSIS — R05 Cough: Secondary | ICD-10-CM

## 2012-09-05 DIAGNOSIS — Z23 Encounter for immunization: Secondary | ICD-10-CM

## 2012-09-05 DIAGNOSIS — R259 Unspecified abnormal involuntary movements: Secondary | ICD-10-CM

## 2012-09-05 NOTE — Progress Notes (Signed)
  Subjective:    Patient ID: Crystal Hodge, female    DOB: 04-20-1946, 66 y.o.   MRN: [id number]  HPI patient presents to clinic for evaluation of chronic cough. Notes a one-month history of chronic cough productive for clear white sputum without hemoptysis. Also intimately feels the need to cough with associated chest heaviness. No true chest pain and it is nonexertional. No associated wheezing. Wonders if has been exposed to mold in her home. Believes is affecting her voice most recently as well. Does bring up chronic head tremor and she does not notice but friends point now. Is unchanged. Has no red flags for parkinsonian disorders. States she was evaluated by a neurologist approximately 2 years ago and tremors felt to be essential.  Past Medical History  Diagnosis Date  . Diabetes mellitus     type II  . Hypertension   . Hyperlipidemia   . Thyroid disease   . Hypothyroidism   . Rectocele   . Menopause   . Obesity   . Tremor 04/07/2011   Past Surgical History  Procedure Date  . Abdominal hysterectomy 07/28/95  . Carpal tunnel release 10/07    Right    reports that she quit smoking about 21 years ago. She has never used smokeless tobacco. She reports that she drinks alcohol. She reports that she does not use illicit drugs. family history includes Diabetes in her son; Heart disease in her father; and Hyperlipidemia in her father and mother. Allergies  Allergen Reactions  . Amoxicillin Diarrhea     Review of Systems see hpi     Objective:   Physical Exam  Nursing note and vitals reviewed. Constitutional: She appears well-developed and well-nourished. No distress.  HENT:  Head: Normocephalic and atraumatic.  Eyes: Conjunctivae normal are normal.  Neck: No JVD present.  Cardiovascular: Normal rate, regular rhythm and normal heart sounds.  Exam reveals no gallop and no friction rub.   No murmur heard. Pulmonary/Chest: Effort normal and breath sounds normal. No respiratory  distress. She has no wheezes. She has no rales.  Neurological: She is alert.       No tremor  Skin: She is not diaphoretic.  Psychiatric: She has a normal mood and affect.          Assessment & Plan:

## 2012-09-05 NOTE — Assessment & Plan Note (Signed)
Attempt sample of Advair one inhalation twice a day. Recommend mouth rinsing afterwards. Technique demonstrated in clinic with sample. Follow closely if no improvement or worsening.

## 2012-09-05 NOTE — Assessment & Plan Note (Signed)
Stable. No worsening or red flag symptoms. Previous neurology evaluation unrevealing reportedly palpation.

## 2012-09-07 ENCOUNTER — Ambulatory Visit: Payer: Medicare Other

## 2012-12-09 ENCOUNTER — Telehealth: Payer: Self-pay | Admitting: Internal Medicine

## 2012-12-09 NOTE — Telephone Encounter (Signed)
Please advise 

## 2012-12-09 NOTE — Telephone Encounter (Signed)
Patient is requesting a refill of hormone cream to be sent to [facility]. P: [id number]

## 2012-12-12 ENCOUNTER — Ambulatory Visit (HOSPITAL_BASED_OUTPATIENT_CLINIC_OR_DEPARTMENT_OTHER): Payer: Medicare Other

## 2012-12-12 ENCOUNTER — Other Ambulatory Visit: Payer: Self-pay | Admitting: Internal Medicine

## 2012-12-12 DIAGNOSIS — Z139 Encounter for screening, unspecified: Secondary | ICD-10-CM

## 2012-12-12 NOTE — Telephone Encounter (Signed)
OK to send 1 month supply with 5 refills please.

## 2012-12-13 ENCOUNTER — Ambulatory Visit (HOSPITAL_BASED_OUTPATIENT_CLINIC_OR_DEPARTMENT_OTHER)
Admission: RE | Admit: 2012-12-13 | Discharge: 2012-12-13 | Disposition: A | Discharge status: Home / Self Care | Payer: Medicare Other | Source: Ambulatory Visit | Attending: Internal Medicine | Admitting: Internal Medicine

## 2012-12-13 DIAGNOSIS — Z139 Encounter for screening, unspecified: Secondary | ICD-10-CM

## 2012-12-13 MED ORDER — HORMONE CREAM BASE CREA: TOPICAL_CREAM | Status: DC

## 2012-12-13 NOTE — Telephone Encounter (Signed)
Refills sent, pt notified. 

## 2013-01-27 ENCOUNTER — Ambulatory Visit (INDEPENDENT_AMBULATORY_CARE_PROVIDER_SITE_OTHER): Payer: Medicare Other | Admitting: Ophthalmology

## 2013-02-02 ENCOUNTER — Ambulatory Visit (INDEPENDENT_AMBULATORY_CARE_PROVIDER_SITE_OTHER): Payer: Medicare Other | Admitting: Ophthalmology

## 2013-02-02 DIAGNOSIS — I10 Essential (primary) hypertension: Secondary | ICD-10-CM

## 2013-02-02 DIAGNOSIS — H35039 Hypertensive retinopathy, unspecified eye: Secondary | ICD-10-CM

## 2013-05-23 ENCOUNTER — Other Ambulatory Visit: Payer: Self-pay | Admitting: Endocrinology

## 2013-05-23 DIAGNOSIS — E039 Hypothyroidism, unspecified: Secondary | ICD-10-CM

## 2013-05-23 DIAGNOSIS — E785 Hyperlipidemia, unspecified: Secondary | ICD-10-CM

## 2013-05-30 ENCOUNTER — Other Ambulatory Visit: Payer: Medicare Other

## 2013-06-01 ENCOUNTER — Ambulatory Visit: Payer: Medicare Other | Admitting: Endocrinology

## 2013-06-12 ENCOUNTER — Other Ambulatory Visit: Payer: Medicare Other

## 2013-06-13 ENCOUNTER — Other Ambulatory Visit (INDEPENDENT_AMBULATORY_CARE_PROVIDER_SITE_OTHER): Payer: Medicare Other

## 2013-06-13 DIAGNOSIS — E039 Hypothyroidism, unspecified: Secondary | ICD-10-CM

## 2013-06-13 DIAGNOSIS — IMO0001 Reserved for inherently not codable concepts without codable children: Secondary | ICD-10-CM

## 2013-06-13 DIAGNOSIS — E785 Hyperlipidemia, unspecified: Secondary | ICD-10-CM

## 2013-06-13 LAB — MICROALBUMIN / CREATININE URINE RATIO: Microalb Creat Ratio: 0.2 mg/g (ref 0.0–30.0)

## 2013-06-13 LAB — URINALYSIS, ROUTINE W REFLEX MICROSCOPIC
Ketones, ur: NEGATIVE
Nitrite: NEGATIVE
RBC / HPF: NONE SEEN (ref 0–?)
Specific Gravity, Urine: <=1.005 (ref 1.000–1.030)
pH: 6 (ref 5.0–8.0)

## 2013-06-13 LAB — LIPID PANEL
Cholesterol: 179 mg/dL (ref 0–200)
VLDL: 32.6 mg/dL (ref 0.0–40.0)

## 2013-06-13 LAB — BASIC METABOLIC PANEL
CO2: 25 mEq/L (ref 19–32)
GFR: 75.4 mL/min (ref >60.00)
Glucose, Bld: 119 mg/dL — ABNORMAL HIGH (ref 70–99)
Potassium: 4.5 mEq/L (ref 3.5–5.1)
Sodium: 138 mEq/L (ref 135–145)

## 2013-06-13 LAB — TSH: TSH: 3.35 u[IU]/mL (ref 0.35–5.50)

## 2013-06-15 ENCOUNTER — Encounter: Payer: Self-pay | Admitting: Endocrinology

## 2013-06-15 ENCOUNTER — Ambulatory Visit (INDEPENDENT_AMBULATORY_CARE_PROVIDER_SITE_OTHER): Payer: Medicare Other | Admitting: Endocrinology

## 2013-06-15 ENCOUNTER — Ambulatory Visit: Payer: Medicare Other | Admitting: Endocrinology

## 2013-06-15 VITALS — BP 122/78 | HR 77 | Temp 98.3°F | Resp 12 | Ht 70.0 in

## 2013-06-15 DIAGNOSIS — E039 Hypothyroidism, unspecified: Secondary | ICD-10-CM

## 2013-06-15 DIAGNOSIS — I10 Essential (primary) hypertension: Secondary | ICD-10-CM

## 2013-06-15 DIAGNOSIS — IMO0001 Reserved for inherently not codable concepts without codable children: Secondary | ICD-10-CM

## 2013-06-15 DIAGNOSIS — E785 Hyperlipidemia, unspecified: Secondary | ICD-10-CM

## 2013-06-15 NOTE — Progress Notes (Signed)
Patient ID: Crystal Hodge, female   DOB: 1946/09/12, 67 y.o.   MRN: [id number]  Reason for Appointment: Diabetes follow-up   History of Present Illness   Diagnosis: Type 2 diabetes mellitus, date of diagnosis: 2008.  Complications: peripheral neuropathy.  Monitors blood glucose: Less than once a day.  Glucometer: Freestyle.  Blood Glucose readings: 164, 141, 102 nonfasting  Hypoglycemia frequency: She feels her blood sugar getting low about 4 hours after breakfast periodically.  Meals: Recently eating more carbohydrate portions and sweets. Will sometimes add cheese or egg to breakfast  Physical activity: exercise: Only recently playing tennis, walking.   Dietician visit: Most recent:5/11.  She has worsening control of her diabetes with HEMOGLOBIN A1c increased from previously. She thinks this is from poor diet over the last few months although trying to do better now.  She does not want to check her weight again. She has been variably compliant with her exercise regimen but better recently. Again has checked blood sugars very sporadically. She was having some nausea with metformin in the morning and this is better with reducing the night dose to half a tablet She refuses to get her weight checked in may have gained weight according to her history  The last HbgA1c was 7%, and now 7.5%.    HYPERTENSION:  blood pressure is well controlled with using Lotensin 10 mg  HYPERLIPIDEMIA:         The lipid abnormality consists of elevated LDL which is now 107, triglycerides fairly good at 163 and HDL still relatively low at 39 .    HYPOTHYROIDISM: She is compliant with her medication and has no unusual fatigue. TSH is normal with 100 mcg levothyroxine  Appointment on 06/13/2013  Component Date Value Range Status  . Hemoglobin A1C 06/13/2013 7.5* 4.6 - 6.5 % Final   Glycemic Control Guidelines for People with Diabetes:Non Diabetic:  <6%Goal of Therapy: <7%Additional Action Suggested:  >8%   .  Sodium 06/13/2013 138  135 - 145 mEq/L Final  . Potassium 06/13/2013 4.5  3.5 - 5.1 mEq/L Final  . Chloride 06/13/2013 105  96 - 112 mEq/L Final  . CO2 06/13/2013 25  19 - 32 mEq/L Final  . Glucose, Bld 06/13/2013 119* 70 - 99 mg/dL Final  . BUN 82/95/6213 18  6 - 23 mg/dL Final  . Creatinine, Ser 06/13/2013 0.8  0.4 - 1.2 mg/dL Final  . Calcium 08/65/7846 9.3  8.4 - 10.5 mg/dL Final  . GFR 96/29/5284 75.40  >60.00 mL/min Final  . Microalb, Ur 06/13/2013 0.1  0.0 - 1.9 mg/dL Final  . Creatinine,U 13/24/4010 45.7   Final  . Microalb Creat Ratio 06/13/2013 0.2  0.0 - 30.0 mg/g Final  . Cholesterol 06/13/2013 179  0 - 200 mg/dL Final   ATP III Classification       Desirable:  < 200 mg/dL               Borderline High:  200 - 239 mg/dL          High:  > = 272 mg/dL  . Triglycerides 06/13/2013 163.0* 0.0 - 149.0 mg/dL Final   Normal:  <536 mg/dLBorderline High:  150 - 199 mg/dL  . HDL 06/13/2013 39.10  >39.00 mg/dL Final  . VLDL 64/40/3474 32.6  0.0 - 40.0 mg/dL Final  . LDL Cholesterol 06/13/2013 107* 0 - 99 mg/dL Final  . Total CHOL/HDL Ratio 06/13/2013 5   Final  Men          Women1/2 Average Risk     3.4          3.3Average Risk          5.0          4.42X Average Risk          9.6          7.13X Average Risk          15.0          11.0                      . Free T4 06/13/2013 1.02  0.60 - 1.60 ng/dL Final  . TSH 16/08/9603 3.35  0.35 - 5.50 uIU/mL Final  . Color, Urine 06/13/2013 LT. YELLOW  Yellow;Lt. Yellow Final  . APPearance 06/13/2013 CLEAR  Clear Final  . Specific Gravity, Urine 06/13/2013 <=1.005  1.000 - 1.030 Final  . pH 06/13/2013 6.0  5.0 - 8.0 Final  . Total Protein, Urine 06/13/2013 NEGATIVE  Negative Final  . Urine Glucose 06/13/2013 NEGATIVE  Negative Final  . Ketones, ur 06/13/2013 NEGATIVE  Negative Final  . Bilirubin Urine 06/13/2013 NEGATIVE  Negative Final  . Hgb urine dipstick 06/13/2013 NEGATIVE  Negative Final  . Urobilinogen, UA 06/13/2013 0.2   0.0 - 1.0 Final  . Leukocytes, UA 06/13/2013 TRACE  Negative Final  . Nitrite 06/13/2013 NEGATIVE  Negative Final  . WBC, UA 06/13/2013 0-2/hpf  0-2/hpf Final  . RBC / HPF 06/13/2013 none seen  0-2/hpf Final  . Squamous Epithelial / LPF 06/13/2013 Few(5-10/hpf)  Rare(0-4/hpf) Final  . Bacteria, UA 06/13/2013 Rare(<10/hpf)  None Final      Medication List       This list is accurate as of: 06/15/13 11:59 PM.  Always use your most recent med list.               aspirin 81 MG tablet  Take 81 mg by mouth every morning.     atorvastatin 20 MG tablet  Commonly known as:  LIPITOR  Take 20 mg by mouth daily.     benazepril 10 MG tablet  Commonly known as:  LOTENSIN  Take 10 mg by mouth daily.     Co Q 10 10 MG Caps  Take 100 mg by mouth every morning.     docusate sodium 100 MG capsule  Commonly known as:  COLACE  Take 100 mg by mouth 2 (two) times daily.     Fiber Caps  Take 1 capsule by mouth every morning.     glimepiride 1 MG tablet  Commonly known as:  AMARYL  Take 1 mg by mouth 2 (two) times daily.     Hormone Cream Base Crea  Apply 0.5- 1 ml topical daily. BEIST 6/4- (estriol/estradiol)/Progesterone/Testosterone HRT (10 ml) 1-40-1 mg/ml cream     levothyroxine 100 MCG tablet  Commonly known as:  SYNTHROID, LEVOTHROID  Take 100 mcg by mouth daily.     loratadine 10 MG tablet  Commonly known as:  CLARITIN  Take 10 mg by mouth daily.     metFORMIN 1000 MG (MOD) 24 hr tablet  Commonly known as:  GLUMETZA  Take 1,000 mg by mouth 2 (two) times daily with a meal.     NON FORMULARY  1 tsp honey with Manuka every morning.     triamterene-hydrochlorothiazide 37.5-25 MG per capsule  Commonly known as:  DYAZIDE  Take  1 capsule by mouth daily.        Allergies:  Allergies  Allergen Reactions  . Amoxicillin Diarrhea    Past Medical History  Diagnosis Date  . Diabetes mellitus     type II  . Hypertension   . Hyperlipidemia   . Thyroid disease   .  Hypothyroidism   . Rectocele   . Menopause   . Obesity   . Tremor 04/07/2011    Past Surgical History  Procedure Laterality Date  . Abdominal hysterectomy  07/28/95  . Carpal tunnel release  10/07    Right    Family History  Problem Relation Age of Onset  . Hyperlipidemia Mother   . Hyperlipidemia Father   . Heart disease Father     triple bypass  . Diabetes Son     ?type I    Social History:  reports that she quit smoking about 22 years ago. She has never used smokeless tobacco. She reports that  drinks alcohol. She reports that she does not use illicit drugs.  Review of Systems -  no recent edema, has been taking Dyazide for quite some time No history of numbness or thickening in her feet   Examination:   BP 122/78  Pulse 77  Temp(Src) 98.3 F (36.8 C)  Resp 12  Ht 5\' 10"  (1.778 m)  SpO2 98%  There is no weight on file to calculate BMI.   Assesment:   1. Diabetes type 2, uncontrolled - 250.02  The patient's diabetes control appears to be relatively worse with A1c gradually increasing. She is not checking her blood sugars enough and this was discussed as well as targets She is not wanting to change her medication because she thinks her high readings are from poor diet. She is reluctant to take any injectable medications also, discussed potentially using Victoza She also has a mild intolerance to metformin and we'll change her to metformin ER so that she can take 2000 mg a day She is also concerned about cost of brand name medications  2. Hypothyroidism: Adequately replaced although TSH over 3, symptomatically is doing well and will continue same dose  3. Hyperlipidemia: LDL is not at goal and she is reluctant to increase her Lipitor. She thinks she can do better with diet, discussed saturated fat reduction  Hypertension: Currently controlled and no history of microalbuminuria  PLAN:   1. To check home blood sugars more consistently on waking up or 2 hours after  any meal  2. Walking or other exercise consistently at least every other day.  3. Medication changes:   metformin will be changed to metformin ER 4. Make sure and use protein at breakfast everyday 5. Continue same doses of levothyroxine and Lotensin 6. Will recheck lipids on the next visit  Counseling time over 50% of today's 25 minute visit  Mykaylah Ballman 06/19/2013, 1:06 PM

## 2013-06-15 NOTE — Patient Instructions (Addendum)
Please check blood sugars at least half the time about 2 hours after any meal and as directed on waking up. Please bring blood sugar monitor to each visit  Change to Metformin ER 500mg , 2 twice daily

## 2013-06-19 ENCOUNTER — Other Ambulatory Visit: Payer: Self-pay | Admitting: *Deleted

## 2013-06-19 DIAGNOSIS — E039 Hypothyroidism, unspecified: Secondary | ICD-10-CM | POA: Insufficient documentation

## 2013-06-19 MED ORDER — METFORMIN HCL ER 500 MG PO TB24: 500.0000 mg | ORAL_TABLET | Freq: Every day | ORAL | Status: DC

## 2013-06-19 MED ORDER — GLIMEPIRIDE 1 MG PO TABS: 1.0000 mg | ORAL_TABLET | Freq: Two times a day (BID) | ORAL | Status: DC

## 2013-06-27 ENCOUNTER — Telehealth: Payer: Self-pay | Admitting: *Deleted

## 2013-06-27 NOTE — Telephone Encounter (Signed)
[facility] Triage Call Report Triage Record Num: [id number] Operator: Hyman Bower Patient Name: [facility] Call Date & Time: 06/24/2013 12:46:21PM Patient Phone: [phone] PCP: Marguarite Arbour Patient Gender: Female PCP Fax : [phone] Patient DOB: [birth year] Practice Name: [facility] Reason for Call: Caller: [place]/Patient; PCP: Marguarite Arbour (Adults only); CB#: [phone]; Call regarding Earache; Reports she feels a dull ache in the ears, also feels like she is talking in a tunnel. Triaged per Ear: Symptoms guideline, disposition: see provider within 24 hours for "Constant or intermittent dull earache, throbbing in ears or feeling of fullness; may interfere with sleep or ability to carry out normal activities." Advised patient there are no appointments left for today in the [place] office. Advised her to go to [facility] for evaluation and treatment since office will be closing in 10 minutes for the weekend. Patient verbalized understanding and reports she will go to UC on [address]. Protocol(s) Used: Ear: Symptoms Recommended Outcome per Protocol: See Provider within 24 hours Reason for Outcome: Constant or intermittent dull earache, throbbing in ear(s) or feeling of fullness; may interfere with sleep or ability to carry out normal activities

## 2013-08-21 ENCOUNTER — Telehealth: Payer: Self-pay | Admitting: Endocrinology

## 2013-08-21 NOTE — Telephone Encounter (Signed)
rx sent

## 2013-08-21 NOTE — Telephone Encounter (Signed)
Pt is waiting for refill requests, she has 3 meds that she needs. She says each month she is getting only single fills w/ no refills. Uses [facility] / [facility]

## 2013-08-22 ENCOUNTER — Other Ambulatory Visit: Payer: Medicare Other

## 2013-08-24 ENCOUNTER — Ambulatory Visit: Payer: Medicare Other | Admitting: Endocrinology

## 2013-09-05 ENCOUNTER — Encounter: Payer: Self-pay | Admitting: Family

## 2013-09-05 ENCOUNTER — Ambulatory Visit (INDEPENDENT_AMBULATORY_CARE_PROVIDER_SITE_OTHER): Payer: Medicare Other | Admitting: Family

## 2013-09-05 ENCOUNTER — Ambulatory Visit (HOSPITAL_BASED_OUTPATIENT_CLINIC_OR_DEPARTMENT_OTHER)
Admission: RE | Admit: 2013-09-05 | Discharge: 2013-09-05 | Disposition: A | Discharge status: Home / Self Care | Payer: Medicare Other | Source: Ambulatory Visit | Attending: Family | Admitting: Family

## 2013-09-05 VITALS — BP 100/64 | HR 66 | Temp 98.7°F | Resp 16 | Ht 70.5 in

## 2013-09-05 DIAGNOSIS — R059 Cough, unspecified: Secondary | ICD-10-CM | POA: Insufficient documentation

## 2013-09-05 DIAGNOSIS — M47814 Spondylosis without myelopathy or radiculopathy, thoracic region: Secondary | ICD-10-CM | POA: Insufficient documentation

## 2013-09-05 DIAGNOSIS — R062 Wheezing: Secondary | ICD-10-CM

## 2013-09-05 DIAGNOSIS — R05 Cough: Secondary | ICD-10-CM | POA: Insufficient documentation

## 2013-09-05 DIAGNOSIS — R0989 Other specified symptoms and signs involving the circulatory and respiratory systems: Secondary | ICD-10-CM

## 2013-09-05 DIAGNOSIS — J209 Acute bronchitis, unspecified: Secondary | ICD-10-CM

## 2013-09-05 MED ORDER — ALBUTEROL SULFATE (2.5 MG/3ML) 0.083% IN NEBU
2.5000 mg | INHALATION_SOLUTION | Freq: Once | RESPIRATORY_TRACT | Status: AC
  Administered 2013-09-05: 2.5 mg via RESPIRATORY_TRACT

## 2013-09-05 MED ORDER — METHYLPREDNISOLONE SODIUM SUCC 125 MG IJ SOLR
125.0000 mg | Freq: Once | INTRAMUSCULAR | Status: AC
  Administered 2013-09-05: 125 mg via INTRAMUSCULAR

## 2013-09-05 MED ORDER — AZITHROMYCIN 250 MG PO TABS: ORAL_TABLET | ORAL | Status: DC

## 2013-09-05 MED ORDER — ALBUTEROL SULFATE HFA 108 (90 BASE) MCG/ACT IN AERS: 2.0000 | INHALATION_SPRAY | Freq: Four times a day (QID) | RESPIRATORY_TRACT | Status: DC | PRN

## 2013-09-05 NOTE — Progress Notes (Signed)
Subjective:    Patient ID: Crystal Hodge, female    DOB: 11-16-1946, 67 y.o.   MRN: [id number]  HPI  Crystal Hodge is a 67 yr old female who presents today with chief complaint of cough.  Symptoms started about 6 days ago.  Initially attributed to allergies. Has tried claritin without improvement. Has been using mucinex to help with HS cough- not sleeping well.  Reports that she loses her breath when she tries to walk. Throat feels dry and scratchy. She denies associated wheezing or swelling.  Reports associated fatigue. Former smoker- quit 20 yrs ago.  Reports tmax 99.5.  Reports that she has tolerated a shot of steroids but has hallucinations with oral prednisone.   Review of Systems See HPI  Past Medical History  Diagnosis Date  . Diabetes mellitus     type II  . Hypertension   . Hyperlipidemia   . Thyroid disease   . Hypothyroidism   . Rectocele   . Menopause   . Obesity   . Tremor 04/07/2011    History   Social History  . Marital Status: Widowed    Spouse Name: N/A    Number of Children: 2  . Years of Education: HS   Occupational History  . Retired    Social History Main Topics  . Smoking status: Former Smoker    Quit date: 11/23/1990  . Smokeless tobacco: Never Used  . Alcohol Use: Yes     Comment: occasional  . Drug Use: No  . Sexual Activity: Not Currently    Partners: Male   Other Topics Concern  . Not on file   Social History Narrative   Caffeine: 1 mug coffee daily   Regular exercise: plays tennis.          Past Surgical History  Procedure Laterality Date  . Abdominal hysterectomy  07/28/95  . Carpal tunnel release  10/07    Right    Family History  Problem Relation Age of Onset  . Hyperlipidemia Mother   . Hyperlipidemia Father   . Heart disease Father     triple bypass  . Diabetes Son     ?type I    Allergies  Allergen Reactions  . Amoxicillin Diarrhea    Current Outpatient Prescriptions on File Prior to Visit  Medication Sig  Dispense Refill  . aspirin 81 MG tablet Take 81 mg by mouth every morning.        Marland Kitchen atorvastatin (LIPITOR) 20 MG tablet Take 20 mg by mouth every evening.       . benazepril (LOTENSIN) 10 MG tablet Take 10 mg by mouth daily.        . Coenzyme Q10 (CO Q 10) 10 MG CAPS Take 100 mg by mouth every morning.        . docusate sodium (COLACE) 100 MG capsule Take 100 mg by mouth 2 (two) times daily.        . Fiber CAPS Take 1 capsule by mouth every morning.        Marland Kitchen glimepiride (AMARYL) 1 MG tablet Take 1 tablet (1 mg total) by mouth 2 (two) times daily.  90 tablet  1  . levothyroxine (SYNTHROID, LEVOTHROID) 100 MCG tablet Take 100 mcg by mouth daily.        . NON FORMULARY 1 tsp honey with Manuka every morning.       . triamterene-hydrochlorothiazide (DYAZIDE) 37.5-25 MG per capsule Take 1 capsule by mouth daily.  No current facility-administered medications on file prior to visit.    BP 100/64  Pulse 66  Temp(Src) 98.7 F (37.1 C) (Oral)  Resp 16  Ht 5' 10.5" (1.791 m)  SpO2 99%       Objective:   Physical Exam  Constitutional: She is oriented to person, place, and time. She appears well-developed and well-nourished. No distress.  HENT:  Head: Normocephalic and atraumatic.  Right Ear: Tympanic membrane and ear canal normal.  Left Ear: Tympanic membrane and ear canal normal.  Mouth/Throat: No oropharyngeal exudate, posterior oropharyngeal edema or posterior oropharyngeal erythema.  Eyes: No scleral icterus.  Cardiovascular: Normal rate and regular rhythm.   No murmur heard. Pulmonary/Chest: Effort normal. No respiratory distress. She has wheezes. She has no rales. She exhibits no tenderness.  Musculoskeletal: She exhibits no edema.  Neurological: She is alert and oriented to person, place, and time.  Psychiatric: She has a normal mood and affect. Her behavior is normal. Judgment and thought content normal.          Assessment & Plan:

## 2013-09-05 NOTE — Patient Instructions (Signed)
Please complete your x ray on the first floor. Start zithromax tonight.  Start albuterol 2 puffs every 4-6 hours while awake for the next few days until your breathing is better. Follow up in 1 week as scheduled. Call sooner if your symptoms worsen or if symptoms do not improve.

## 2013-09-08 DIAGNOSIS — J209 Acute bronchitis, unspecified: Secondary | ICD-10-CM | POA: Insufficient documentation

## 2013-09-08 NOTE — Assessment & Plan Note (Addendum)
Will rx with zithromax.  She has tolerated IM steroids in the past but had hallucinations with oral steroids.  IM solumedrol given in the office today. Advised pt to take albuterol every 4-6 hours while awake for the next few days.

## 2013-09-10 ENCOUNTER — Other Ambulatory Visit: Payer: Self-pay | Admitting: Endocrinology

## 2013-09-12 ENCOUNTER — Ambulatory Visit (INDEPENDENT_AMBULATORY_CARE_PROVIDER_SITE_OTHER): Payer: Medicare Other | Admitting: Family Medicine

## 2013-09-12 ENCOUNTER — Ambulatory Visit: Payer: Medicare Other | Admitting: Family Medicine

## 2013-09-12 ENCOUNTER — Encounter: Payer: Self-pay | Admitting: Family Medicine

## 2013-09-12 VITALS — BP 122/78 | HR 80 | Temp 98.4°F | Ht 70.0 in

## 2013-09-12 DIAGNOSIS — J209 Acute bronchitis, unspecified: Secondary | ICD-10-CM

## 2013-09-12 DIAGNOSIS — Z0000 Encounter for general adult medical examination without abnormal findings: Secondary | ICD-10-CM

## 2013-09-12 DIAGNOSIS — J069 Acute upper respiratory infection, unspecified: Secondary | ICD-10-CM

## 2013-09-12 DIAGNOSIS — N816 Rectocele: Secondary | ICD-10-CM

## 2013-09-12 DIAGNOSIS — I10 Essential (primary) hypertension: Secondary | ICD-10-CM

## 2013-09-12 DIAGNOSIS — B958 Unspecified staphylococcus as the cause of diseases classified elsewhere: Secondary | ICD-10-CM

## 2013-09-12 HISTORY — DX: Rectocele: N81.6

## 2013-09-12 HISTORY — DX: Unspecified staphylococcus as the cause of diseases classified elsewhere: B95.8

## 2013-09-12 MED ORDER — HYDROCODONE-HOMATROPINE 5-1.5 MG/5ML PO SYRP: 5.0000 mL | ORAL_SOLUTION | Freq: Three times a day (TID) | ORAL | Status: DC | PRN

## 2013-09-12 MED ORDER — CIPROFLOXACIN HCL 500 MG PO TABS: 500.0000 mg | ORAL_TABLET | Freq: Two times a day (BID) | ORAL | Status: DC

## 2013-09-12 MED ORDER — MUPIROCIN 2 % EX OINT: TOPICAL_OINTMENT | Freq: Three times a day (TID) | CUTANEOUS | Status: DC

## 2013-09-12 MED ORDER — MUPIROCIN 2 % EX OINT: TOPICAL_OINTMENT | Freq: Every day | CUTANEOUS | Status: DC

## 2013-09-12 MED ORDER — METHYLPREDNISOLONE (PAK) 4 MG PO TABS: ORAL_TABLET | ORAL | Status: DC

## 2013-09-12 MED ORDER — BUDESONIDE-FORMOTEROL FUMARATE 160-4.5 MCG/ACT IN AERO: 2.0000 | INHALATION_SPRAY | Freq: Two times a day (BID) | RESPIRATORY_TRACT | Status: DC

## 2013-09-12 NOTE — Patient Instructions (Addendum)
Witch Hazel Astringent for cleaning rectocele any irritation or itching Distilled white vinegar to vaginal mucosa daily after surgery  Add a probiotic such as Digestive Advantage Plain Mucinex 600 mg twice a day for 10 days A zinc preparation such as Coldeeze for 10 days If tolerate Medrol start Symbicort when done if do not tolerate symbicort start 2 puffs twice a day now  Drink extra fluids   Bronchitis Bronchitis is the body's way of reacting to injury and/or infection (inflammation) of the bronchi. Bronchi are the air tubes that extend from the windpipe into the lungs. If the inflammation becomes severe, it may cause shortness of breath. CAUSES  Inflammation may be caused by:  A virus.  Germs (bacteria).  Dust.  Allergens.  Pollutants and many other irritants. The cells lining the bronchial tree are covered with tiny hairs (cilia). These constantly beat upward, away from the lungs, toward the mouth. This keeps the lungs free of pollutants. When these cells become too irritated and are unable to do their job, mucus begins to develop. This causes the characteristic cough of bronchitis. The cough clears the lungs when the cilia are unable to do their job. Without either of these protective mechanisms, the mucus would settle in the lungs. Then you would develop pneumonia. Smoking is a common cause of bronchitis and can contribute to pneumonia. Stopping this habit is the single most important thing you can do to help yourself. TREATMENT   Your caregiver may prescribe an antibiotic if the cough is caused by bacteria. Also, medicines that open up your airways make it easier to breathe. Your caregiver may also recommend or prescribe an expectorant. It will loosen the mucus to be coughed up. Only take over-the-counter or prescription medicines for pain, discomfort, or fever as directed by your caregiver.  Removing whatever causes the problem (smoking, for example) is critical to preventing  the problem from getting worse.  Cough suppressants may be prescribed for relief of cough symptoms.  Inhaled medicines may be prescribed to help with symptoms now and to help prevent problems from returning.  For those with recurrent (chronic) bronchitis, there may be a need for steroid medicines. SEEK IMMEDIATE MEDICAL CARE IF:   During treatment, you develop more pus-like mucus (purulent sputum).  You have a fever.  Your baby is older than 3 months with a rectal temperature of 102 F (38.9 C) or higher.  Your baby is 47 months old or younger with a rectal temperature of 100.4 F (38 C) or higher.  You become progressively more ill.  You have increased difficulty breathing, wheezing, or shortness of breath. It is necessary to seek immediate medical care if you are elderly or sick from any other disease. MAKE SURE YOU:   Understand these instructions.  Will watch your condition.  Will get help right away if you are not doing well or get worse. Document Released: 11/09/2005 Document Revised: 02/01/2012 Document Reviewed: 09/18/2008 [facility] Patient Information 2014 [place], [place].

## 2013-09-14 ENCOUNTER — Telehealth: Payer: Self-pay

## 2013-09-14 NOTE — Telephone Encounter (Signed)
Pt left a message stating she is not feeling any better. Pt thinks her medication could be making her feel worse. Pt states she feels like she is out of it.

## 2013-09-15 NOTE — Telephone Encounter (Signed)
Patient is feeling better today.

## 2013-09-16 ENCOUNTER — Encounter: Payer: Self-pay | Admitting: Family Medicine

## 2013-09-16 DIAGNOSIS — Z0000 Encounter for general adult medical examination without abnormal findings: Secondary | ICD-10-CM

## 2013-09-16 DIAGNOSIS — J069 Acute upper respiratory infection, unspecified: Secondary | ICD-10-CM

## 2013-09-16 HISTORY — DX: Acute upper respiratory infection, unspecified: J06.9

## 2013-09-16 HISTORY — DX: Encounter for general adult medical examination without abnormal findings: Z00.00

## 2013-09-16 NOTE — Assessment & Plan Note (Signed)
Increasingly symptomatic, reducible per patient, encouraged probiotics, add a fiber supplement and adequate hydration. Consider referral to surgery for further consideration. Patient declines today

## 2013-09-16 NOTE — Progress Notes (Signed)
Patient ID: Crystal Hodge, female   DOB: [birth year], 67 y.o.   MRN: [id number] Crystal Hodge [facility] [id number] 01/18/1946 09/16/2013      Progress Note-Follow Up  Subjective  Chief Complaint  Chief Complaint  Patient presents with  . Follow-up    pt wants to meet Dr Abner Greenspan- was a [facility] pt  . Wheezing    saw Melissa on 09-05-13    HPI  Is a 67 year old Caucasian female who is here today to establish care. She is previously been seen infrequently by other practitioners in this office. Would like to establish care. Is established with endocrinology and sees Dr. Lucianne Muss. Her acute complaints are some fatigue and respiratory symptoms. She has some hoarseness and congestion. No fevers or chills. No chest pain or palpitations. She is complaining of some thickened toenails and rectocele as well. In the past colonoscopies revealed hemorrhoids in the last year she's noted intermittently prolapsing and resolving rectocele with rectal irritation at times. Denies any melena or significant bloody stool. She is taking meds as prescribed and denies any polyuria or polydipsia   Past Medical History  Diagnosis Date  . Diabetes mellitus     type II  . Hypertension   . Hyperlipidemia   . Thyroid disease   . Hypothyroidism   . Rectocele   . Menopause   . Obesity   . Tremor 04/07/2011  . Staph infection 09/12/2013  . Rectocele 09/12/2013  . Preventative health care 09/16/2013  . Acute upper respiratory infections of unspecified site 09/16/2013    Past Surgical History  Procedure Laterality Date  . Abdominal hysterectomy  07/28/95  . Carpal tunnel release  10/07    Right    Family History  Problem Relation Age of Onset  . Hyperlipidemia Mother   . Hyperlipidemia Father   . Heart disease Father     triple bypass  . Diabetes Son     ?type I    History   Social History  . Marital Status: Widowed    Spouse Name: N/A    Number of Children: 2  . Years of Education: HS   Occupational History  .  Retired    Social History Main Topics  . Smoking status: Former Smoker    Quit date: 11/23/1990  . Smokeless tobacco: Never Used  . Alcohol Use: Yes     Comment: occasional  . Drug Use: No  . Sexual Activity: Not Currently    Partners: Male   Other Topics Concern  . Not on file   Social History Narrative   Caffeine: 1 mug coffee daily   Regular exercise: plays tennis.          Current Outpatient Prescriptions on File Prior to Visit  Medication Sig Dispense Refill  . aspirin 81 MG tablet Take 81 mg by mouth every morning.        Marland Kitchen atorvastatin (LIPITOR) 20 MG tablet Take 20 mg by mouth every evening.       . benazepril (LOTENSIN) 10 MG tablet Take 10 mg by mouth daily.        . Cholecalciferol (VITAMIN D3) 2000 UNITS TABS Take 2,000 Units by mouth every morning.      . Coenzyme Q10 (CO Q 10) 10 MG CAPS Take 100 mg by mouth every morning.        . docusate sodium (COLACE) 100 MG capsule Take 100 mg by mouth 2 (two) times daily.        . Fiber CAPS Take  1 capsule by mouth every morning.        Marland Kitchen glimepiride (AMARYL) 1 MG tablet TAKE ONE TABLET BY MOUTH TWICE DAILY   90 tablet  3  . levothyroxine (SYNTHROID, LEVOTHROID) 100 MCG tablet Take 100 mcg by mouth daily.        . metFORMIN (GLUCOPHAGE) 1000 MG tablet Take 1,000 mg by mouth 2 (two) times daily.      . NON FORMULARY 1 tsp honey with Manuka every morning.       . Omega-3 Fatty Acids (FISH OIL) 1200 MG CAPS Take 1,200 mg by mouth every morning.      . triamterene-hydrochlorothiazide (DYAZIDE) 37.5-25 MG per capsule Take 1 capsule by mouth daily.      Marland Kitchen albuterol (PROVENTIL HFA;VENTOLIN HFA) 108 (90 BASE) MCG/ACT inhaler Inhale 2 puffs into the lungs every 6 (six) hours as needed for wheezing.  1 Inhaler  0   No current facility-administered medications on file prior to visit.    Allergies  Allergen Reactions  . Amoxicillin Diarrhea  . Prednisone     Hallucinations- can't take oral    Review of Systems  Review of  Systems  Constitutional: Negative for fever and malaise/fatigue.  HENT: Negative for congestion.   Eyes: Negative for discharge.  Respiratory: Negative for shortness of breath.   Cardiovascular: Negative for chest pain, palpitations and leg swelling.  Gastrointestinal: Negative for nausea, abdominal pain and diarrhea.  Genitourinary: Negative for dysuria.  Musculoskeletal: Negative for falls.  Skin: Negative for rash.  Neurological: Negative for loss of consciousness and headaches.  Endo/Heme/Allergies: Negative for polydipsia.  Psychiatric/Behavioral: Negative for depression and suicidal ideas. The patient is not nervous/anxious and does not have insomnia.     Objective  BP 122/78  Pulse 80  Temp(Src) 98.4 F (36.9 C) (Oral)  Ht 5\' 10"  (1.778 m)  SpO2 96%  Physical Exam  Physical Exam  Constitutional: She is oriented to person, place, and time and well-developed, well-nourished, and in no distress. No distress.  HENT:  Head: Normocephalic and atraumatic.  Eyes: Conjunctivae are normal.  Neck: Neck supple. No thyromegaly present.  Cardiovascular: Normal rate, regular rhythm and normal heart sounds.   No murmur heard. Pulmonary/Chest: Effort normal and breath sounds normal. She has no wheezes.  Abdominal: She exhibits no distension and no mass.  Musculoskeletal: She exhibits no edema.  Lymphadenopathy:    She has no cervical adenopathy.  Neurological: She is alert and oriented to person, place, and time.  Skin: Skin is warm and dry. No rash noted. She is not diaphoretic.  Psychiatric: Memory, affect and judgment normal.    Lab Results  Component Value Date   TSH 3.35 06/13/2013   Lab Results  Component Value Date   WBC 11.0* 05/14/2011   HGB 13.4 05/14/2011   HCT 43.7 05/14/2011   MCV 80.8 05/14/2011   PLT 361 05/14/2011   Lab Results  Component Value Date   CREATININE 0.8 06/13/2013   BUN 18 06/13/2013   NA 138 06/13/2013   K 4.5 06/13/2013   CL 105 06/13/2013    CO2 25 06/13/2013   No results found for this basename: ALT, AST, GGT, ALKPHOS, BILITOT   Lab Results  Component Value Date   CHOL 179 06/13/2013   Lab Results  Component Value Date   HDL 39.10 06/13/2013   Lab Results  Component Value Date   LDLCALC 107* 06/13/2013   Lab Results  Component Value Date   TRIG 163.0* 06/13/2013  Lab Results  Component Value Date   CHOLHDL 5 06/13/2013     Assessment & Plan  HTN (hypertension) Well controlled no changes.  Type II or unspecified type diabetes mellitus without mention of complication, uncontrolled Last hgba1c 7.5, is tolerating meds, no changes today  Rectocele Increasingly symptomatic, reducible per patient, encouraged probiotics, add a fiber supplement and adequate hydration. Consider referral to surgery for further consideration. Patient declines today  Preventative health care Agrees to return for annual exam  Acute upper respiratory infections of unspecified site Likely viral, enocuraged Mucinex, Zinc, probiotics, increase rest and fluids. Call if worsens

## 2013-09-16 NOTE — Assessment & Plan Note (Signed)
Last hgba1c 7.5, is tolerating meds, no changes today

## 2013-09-16 NOTE — Assessment & Plan Note (Signed)
Likely viral, enocuraged Mucinex, Zinc, probiotics, increase rest and fluids. Call if worsens

## 2013-09-16 NOTE — Assessment & Plan Note (Signed)
Well controlled no changes 

## 2013-09-16 NOTE — Assessment & Plan Note (Signed)
Agrees to return for annual exam

## 2013-09-18 ENCOUNTER — Telehealth: Payer: Self-pay | Admitting: *Deleted

## 2013-09-18 NOTE — Telephone Encounter (Signed)
Office Message [address] [place], [place] [address] p. [phone] f. [phone] To: [name] (After Hours Triage) Fax: [phone] From: [facility] Date/ Time: 09/18/2013 12:11 PM Taken By: Jethro BolusWille Celeste Facility: home Patient: Crystal Hodge, Crystal Hodge DOB: September 16, 1946 Phone: [phone] Reason for Call: Patient would like someone to call her at [phone] Regarding Appointment: Appt Date: Appt Time: Unknown Provider: Reason: Details: Outcome:

## 2013-09-19 ENCOUNTER — Other Ambulatory Visit: Payer: Self-pay | Admitting: *Deleted

## 2013-09-19 ENCOUNTER — Other Ambulatory Visit: Payer: Medicare Other

## 2013-09-19 DIAGNOSIS — E785 Hyperlipidemia, unspecified: Secondary | ICD-10-CM

## 2013-09-19 DIAGNOSIS — E119 Type 2 diabetes mellitus without complications: Secondary | ICD-10-CM

## 2013-09-19 DIAGNOSIS — E039 Hypothyroidism, unspecified: Secondary | ICD-10-CM

## 2013-09-19 NOTE — Telephone Encounter (Signed)
Left message on voicemail to return my call.  

## 2013-09-21 ENCOUNTER — Ambulatory Visit: Payer: Medicare Other | Admitting: Endocrinology

## 2013-09-22 ENCOUNTER — Other Ambulatory Visit: Payer: Self-pay | Admitting: *Deleted

## 2013-09-22 ENCOUNTER — Telehealth: Payer: Self-pay | Admitting: Endocrinology

## 2013-09-22 MED ORDER — BENAZEPRIL HCL 10 MG PO TABS: 10.0000 mg | ORAL_TABLET | Freq: Every day | ORAL | Status: DC

## 2013-09-22 MED ORDER — LEVOTHYROXINE SODIUM 100 MCG PO TABS: 100.0000 ug | ORAL_TABLET | Freq: Every day | ORAL | Status: DC

## 2013-09-22 MED ORDER — GLUCOSE BLOOD VI STRP: ORAL_STRIP | Status: DC

## 2013-09-22 NOTE — Telephone Encounter (Signed)
rxs sent

## 2013-09-25 ENCOUNTER — Other Ambulatory Visit: Payer: Self-pay | Admitting: *Deleted

## 2013-09-25 ENCOUNTER — Telehealth: Payer: Self-pay | Admitting: Endocrinology

## 2013-09-25 MED ORDER — GLUCOSE BLOOD VI STRP: ORAL_STRIP | Status: DC

## 2013-09-25 NOTE — Telephone Encounter (Signed)
rx sent

## 2013-09-26 ENCOUNTER — Ambulatory Visit: Payer: Medicare Other | Admitting: Family Medicine

## 2013-09-29 ENCOUNTER — Ambulatory Visit (INDEPENDENT_AMBULATORY_CARE_PROVIDER_SITE_OTHER): Payer: Medicare Other | Admitting: Family Medicine

## 2013-09-29 ENCOUNTER — Encounter: Payer: Self-pay | Admitting: Family Medicine

## 2013-09-29 VITALS — BP 140/78 | HR 64 | Temp 98.6°F | Ht 70.0 in

## 2013-09-29 DIAGNOSIS — E785 Hyperlipidemia, unspecified: Secondary | ICD-10-CM

## 2013-09-29 DIAGNOSIS — I10 Essential (primary) hypertension: Secondary | ICD-10-CM

## 2013-09-29 DIAGNOSIS — R002 Palpitations: Secondary | ICD-10-CM

## 2013-09-29 DIAGNOSIS — E119 Type 2 diabetes mellitus without complications: Secondary | ICD-10-CM

## 2013-09-29 NOTE — Patient Instructions (Signed)
Start a Claritin  Daily and nasal saline    Allergies Allergies may happen from anything your body is sensitive to. This may be food, medicines, pollens, chemicals, and nearly anything around you in everyday life that produces allergens. An allergen is anything that causes an allergy producing substance. Heredity is often a factor in causing these problems. This means you may have some of the same allergies as your parents. Food allergies happen in all age groups. Food allergies are some of the most severe and life threatening. Some common food allergies are cow's milk, seafood, eggs, nuts, wheat, and soybeans. SYMPTOMS   Swelling around the mouth.  An itchy red rash or hives.  Vomiting or diarrhea.  Difficulty breathing. SEVERE ALLERGIC REACTIONS ARE LIFE-THREATENING. This reaction is called anaphylaxis. It can cause the mouth and throat to swell and cause difficulty with breathing and swallowing. In severe reactions only a trace amount of food (for example, peanut oil in a salad) may cause death within seconds. Seasonal allergies occur in all age groups. These are seasonal because they usually occur during the same season every year. They may be a reaction to molds, grass pollens, or tree pollens. Other causes of problems are house dust mite allergens, pet dander, and mold spores. The symptoms often consist of nasal congestion, a runny itchy nose associated with sneezing, and tearing itchy eyes. There is often an associated itching of the mouth and ears. The problems happen when you come in contact with pollens and other allergens. Allergens are the particles in the air that the body reacts to with an allergic reaction. This causes you to release allergic antibodies. Through a chain of events, these eventually cause you to release histamine into the blood stream. Although it is meant to be protective to the body, it is this release that causes your discomfort. This is why you were given  anti-histamines to feel better. If you are unable to pinpoint the offending allergen, it may be determined by skin or blood testing. Allergies cannot be cured but can be controlled with medicine. Hay fever is a collection of all or some of the seasonal allergy problems. It may often be treated with simple over-the-counter medicine such as diphenhydramine. Take medicine as directed. Do not drink alcohol or drive while taking this medicine. Check with your caregiver or package insert for child dosages. If these medicines are not effective, there are many new medicines your caregiver can prescribe. Stronger medicine such as nasal spray, eye drops, and corticosteroids may be used if the first things you try do not work well. Other treatments such as immunotherapy or desensitizing injections can be used if all else fails. Follow up with your caregiver if problems continue. These seasonal allergies are usually not life threatening. They are generally more of a nuisance that can often be handled using medicine. HOME CARE INSTRUCTIONS   If unsure what causes a reaction, keep a diary of foods eaten and symptoms that follow. Avoid foods that cause reactions.  If hives or rash are present:  Take medicine as directed.  You may use an over-the-counter antihistamine (diphenhydramine) for hives and itching as needed.  Apply cold compresses (cloths) to the skin or take baths in cool water. Avoid hot baths or showers. Heat will make a rash and itching worse.  If you are severely allergic:  Following a treatment for a severe reaction, hospitalization is often required for closer follow-up.  Wear a medic-alert bracelet or necklace stating the allergy.  You  and your family must learn how to give adrenaline or use an anaphylaxis kit.  If you have had a severe reaction, always carry your anaphylaxis kit or EpiPen with you. Use this medicine as directed by your caregiver if a severe reaction is occurring. Failure  to do so could have a fatal outcome. SEEK MEDICAL CARE IF:  You suspect a food allergy. Symptoms generally happen within 30 minutes of eating a food.  Your symptoms have not gone away within 2 days or are getting worse.  You develop new symptoms.  You want to retest yourself or your child with a food or drink you think causes an allergic reaction. Never do this if an anaphylactic reaction to that food or drink has happened before. Only do this under the care of a caregiver. SEEK IMMEDIATE MEDICAL CARE IF:   You have difficulty breathing, are wheezing, or have a tight feeling in your chest or throat.  You have a swollen mouth, or you have hives, swelling, or itching all over your body.  You have had a severe reaction that has responded to your anaphylaxis kit or an EpiPen. These reactions may return when the medicine has worn off. These reactions should be considered life threatening. MAKE SURE YOU:   Understand these instructions.  Will watch your condition.  Will get help right away if you are not doing well or get worse. Document Released: 02/02/2003 Document Revised: 03/06/2013 Document Reviewed: 07/09/2008 [facility] Patient Information 2014 [place].

## 2013-10-02 ENCOUNTER — Encounter: Payer: Self-pay | Admitting: Family Medicine

## 2013-10-02 ENCOUNTER — Other Ambulatory Visit (INDEPENDENT_AMBULATORY_CARE_PROVIDER_SITE_OTHER): Payer: Medicare Other

## 2013-10-02 DIAGNOSIS — E785 Hyperlipidemia, unspecified: Secondary | ICD-10-CM

## 2013-10-02 DIAGNOSIS — E119 Type 2 diabetes mellitus without complications: Secondary | ICD-10-CM

## 2013-10-02 DIAGNOSIS — E039 Hypothyroidism, unspecified: Secondary | ICD-10-CM

## 2013-10-02 LAB — URINALYSIS, ROUTINE W REFLEX MICROSCOPIC
Bilirubin Urine: NEGATIVE
Hgb urine dipstick: NEGATIVE
Specific Gravity, Urine: 1.025 (ref 1.000–1.030)
Urine Glucose: NEGATIVE
Urobilinogen, UA: 0.2 (ref 0.0–1.0)
pH: 6 (ref 5.0–8.0)

## 2013-10-02 LAB — HEMOGLOBIN A1C: Hgb A1c MFr Bld: 7.2 % — ABNORMAL HIGH (ref 4.6–6.5)

## 2013-10-02 LAB — LIPID PANEL
Cholesterol: 162 mg/dL (ref 0–200)
HDL: 38.3 mg/dL — ABNORMAL LOW (ref >39.00)
Total CHOL/HDL Ratio: 4
Triglycerides: 142 mg/dL (ref 0.0–149.0)
VLDL: 28.4 mg/dL (ref 0.0–40.0)

## 2013-10-02 LAB — COMPREHENSIVE METABOLIC PANEL
ALT: 45 U/L — ABNORMAL HIGH (ref 0–35)
AST: 30 U/L (ref 0–37)
Alkaline Phosphatase: 38 U/L — ABNORMAL LOW (ref 39–117)
Calcium: 9 mg/dL (ref 8.4–10.5)
Creatinine, Ser: 0.8 mg/dL (ref 0.4–1.2)
Sodium: 139 mEq/L (ref 135–145)
Total Bilirubin: 0.7 mg/dL (ref 0.3–1.2)
Total Protein: 6.6 g/dL (ref 6.0–8.3)

## 2013-10-02 LAB — MICROALBUMIN / CREATININE URINE RATIO: Microalb Creat Ratio: 0.2 mg/g (ref 0.0–30.0)

## 2013-10-02 LAB — TSH: TSH: 2.34 u[IU]/mL (ref 0.35–5.50)

## 2013-10-02 LAB — T4, FREE: Free T4: 0.95 ng/dL (ref 0.60–1.60)

## 2013-10-02 NOTE — Assessment & Plan Note (Signed)
Tolerating Atorvastatin, avoid trans fats, add krill oil caps 

## 2013-10-02 NOTE — Progress Notes (Signed)
Crystal Hodge [id number] 1946/03/26 10/02/2013      Progress Note-Follow Up  Subjective  Chief Complaint  Chief Complaint  Patient presents with  . Follow-up    2 week    HPI  Patient is a 67 year old Caucasian female who is in today for followup. Reports taking a recent course of steroids for bronchitis and having trouble sleeping. Since being off the medications for sleeping is improved. She's a long history of some intermittent palpitations but they're manageable without associated symptoms. She declines pneumonia shot today. She denies any chest pain no palpitations, diaphoresis, nausea or vomiting. She notes there is no significant pattern to when she experiences the palpitations but very infrequent.  Past Medical History  Diagnosis Date  . Diabetes mellitus     type II  . Hypertension   . Hyperlipidemia   . Thyroid disease   . Hypothyroidism   . Rectocele   . Menopause   . Obesity   . Tremor 04/07/2011  . Staph infection 09/12/2013  . Rectocele 09/12/2013  . Preventative health care 09/16/2013  . Acute upper respiratory infections of unspecified site 09/16/2013    Past Surgical History  Procedure Laterality Date  . Abdominal hysterectomy  07/28/95  . Carpal tunnel release  10/07    Right    Family History  Problem Relation Age of Onset  . Hyperlipidemia Mother   . Hyperlipidemia Father   . Heart disease Father     triple bypass  . Diabetes Son     ?type I    History   Social History  . Marital Status: Widowed    Spouse Name: N/A    Number of Children: 2  . Years of Education: HS   Occupational History  . Retired    Social History Main Topics  . Smoking status: Former Smoker    Quit date: 11/23/1990  . Smokeless tobacco: Never Used  . Alcohol Use: Yes     Comment: occasional  . Drug Use: No  . Sexual Activity: Not Currently    Partners: Male   Other Topics Concern  . Not on file   Social History Narrative   Caffeine: 1 mug coffee daily    Regular exercise: plays tennis.          Current Outpatient Prescriptions on File Prior to Visit  Medication Sig Dispense Refill  . aspirin 81 MG tablet Take 81 mg by mouth every morning.        Marland Kitchen atorvastatin (LIPITOR) 20 MG tablet Take 20 mg by mouth every evening.       . benazepril (LOTENSIN) 10 MG tablet Take 1 tablet (10 mg total) by mouth daily.  30 tablet  5  . Cholecalciferol (VITAMIN D3) 2000 UNITS TABS Take 2,000 Units by mouth every morning.      . Coenzyme Q10 (CO Q 10) 10 MG CAPS Take 100 mg by mouth every morning.        . docusate sodium (COLACE) 100 MG capsule Take 100 mg by mouth 2 (two) times daily.        . Fiber CAPS Take 1 capsule by mouth every morning.        Marland Kitchen glimepiride (AMARYL) 1 MG tablet TAKE ONE TABLET BY MOUTH TWICE DAILY   90 tablet  3  . glucose blood (FREESTYLE LITE) test strip Use as instructed to check blood sugars 2 times per day dx code 250.00  100 each  12  . levothyroxine (SYNTHROID, LEVOTHROID) 100 MCG  tablet Take 1 tablet (100 mcg total) by mouth daily.  30 tablet  5  . metFORMIN (GLUCOPHAGE) 1000 MG tablet Take 1,000 mg by mouth 2 (two) times daily.      . NON FORMULARY 1 tsp honey with Manuka every morning.       . Omega-3 Fatty Acids (FISH OIL) 1200 MG CAPS Take 1,200 mg by mouth every morning.      . triamterene-hydrochlorothiazide (DYAZIDE) 37.5-25 MG per capsule Take 1 capsule by mouth daily.      . mupirocin ointment (BACTROBAN) 2 % Apply topically daily. To nares via clean qtip x 7 days prn nasal lesions at bedtime  22 g  0   No current facility-administered medications on file prior to visit.    Allergies  Allergen Reactions  . Amoxicillin Diarrhea  . Prednisone     Hallucinations- can't take oral    Review of Systems  Review of Systems  Constitutional: Negative for fever and malaise/fatigue.  HENT: Negative for congestion.   Eyes: Negative for discharge.  Respiratory: Negative for shortness of breath.   Cardiovascular:  Positive for palpitations. Negative for chest pain and leg swelling.  Gastrointestinal: Negative for nausea, abdominal pain and diarrhea.  Genitourinary: Negative for dysuria.  Musculoskeletal: Negative for falls.  Skin: Negative for rash.  Neurological: Negative for loss of consciousness and headaches.  Endo/Heme/Allergies: Negative for polydipsia.  Psychiatric/Behavioral: Negative for depression and suicidal ideas. The patient is not nervous/anxious and does not have insomnia.     Objective  BP 140/78  Pulse 64  Temp(Src) 98.6 F (37 C) (Oral)  Ht 5\' 10"  (1.778 m)  SpO2 97%  Physical Exam  Physical Exam  Constitutional: She is oriented to person, place, and time and well-developed, well-nourished, and in no distress. No distress.  HENT:  Head: Normocephalic and atraumatic.  Eyes: Conjunctivae are normal.  Neck: Neck supple. No thyromegaly present.  Cardiovascular: Normal rate, regular rhythm and normal heart sounds.   No murmur heard. Pulmonary/Chest: Effort normal and breath sounds normal. She has no wheezes.  Abdominal: She exhibits no distension and no mass.  Musculoskeletal: She exhibits no edema.  Lymphadenopathy:    She has no cervical adenopathy.  Neurological: She is alert and oriented to person, place, and time.  Skin: Skin is warm and dry. No rash noted. She is not diaphoretic.  Psychiatric: Memory, affect and judgment normal.    Lab Results  Component Value Date   TSH 2.34 10/02/2013   Lab Results  Component Value Date   WBC 11.0* 05/14/2011   HGB 13.4 05/14/2011   HCT 43.7 05/14/2011   MCV 80.8 05/14/2011   PLT 361 05/14/2011   Lab Results  Component Value Date   CREATININE 0.8 10/02/2013   BUN 17 10/02/2013   NA 139 10/02/2013   K 3.9 10/02/2013   CL 105 10/02/2013   CO2 26 10/02/2013   Lab Results  Component Value Date   ALT 45* 10/02/2013   AST 30 10/02/2013   ALKPHOS 38* 10/02/2013   BILITOT 0.7 10/02/2013   Lab Results  Component Value  Date   CHOL 162 10/02/2013   Lab Results  Component Value Date   HDL 38.30* 10/02/2013   Lab Results  Component Value Date   LDLCALC 95 10/02/2013   Lab Results  Component Value Date   TRIG 142.0 10/02/2013   Lab Results  Component Value Date   CHOLHDL 4 10/02/2013     Assessment & Plan  DM2 (diabetes  mellitus, type 2) Tolerating Metformin ER much better than short acting form  HTN (hypertension) Well controlled on current meds  Hyperlipidemia Tolerating Atorvastatin, avoid trans fats, add krill oil caps

## 2013-10-02 NOTE — Assessment & Plan Note (Signed)
Tolerating Metformin ER much better than short acting form

## 2013-10-02 NOTE — Assessment & Plan Note (Signed)
Well-controlled on current meds 

## 2013-10-03 ENCOUNTER — Ambulatory Visit: Payer: Medicare Other | Admitting: Family Medicine

## 2013-10-03 ENCOUNTER — Other Ambulatory Visit: Payer: Medicare Other

## 2013-10-04 ENCOUNTER — Encounter (INDEPENDENT_AMBULATORY_CARE_PROVIDER_SITE_OTHER): Payer: Medicare Other | Admitting: Ophthalmology

## 2013-10-04 DIAGNOSIS — H35039 Hypertensive retinopathy, unspecified eye: Secondary | ICD-10-CM

## 2013-10-04 DIAGNOSIS — I10 Essential (primary) hypertension: Secondary | ICD-10-CM

## 2013-10-04 DIAGNOSIS — H43819 Vitreous degeneration, unspecified eye: Secondary | ICD-10-CM

## 2013-10-04 DIAGNOSIS — E1139 Type 2 diabetes mellitus with other diabetic ophthalmic complication: Secondary | ICD-10-CM

## 2013-10-04 DIAGNOSIS — E11319 Type 2 diabetes mellitus with unspecified diabetic retinopathy without macular edema: Secondary | ICD-10-CM

## 2013-10-04 DIAGNOSIS — H35379 Puckering of macula, unspecified eye: Secondary | ICD-10-CM

## 2013-10-04 DIAGNOSIS — H2510 Age-related nuclear cataract, unspecified eye: Secondary | ICD-10-CM

## 2013-10-04 LAB — HM DIABETES EYE EXAM

## 2013-10-05 ENCOUNTER — Ambulatory Visit (INDEPENDENT_AMBULATORY_CARE_PROVIDER_SITE_OTHER): Payer: Medicare Other | Admitting: Endocrinology

## 2013-10-05 ENCOUNTER — Encounter: Payer: Self-pay | Admitting: Endocrinology

## 2013-10-05 VITALS — BP 116/70 | HR 83 | Temp 98.3°F | Resp 12 | Ht 70.0 in

## 2013-10-05 DIAGNOSIS — E785 Hyperlipidemia, unspecified: Secondary | ICD-10-CM

## 2013-10-05 DIAGNOSIS — I10 Essential (primary) hypertension: Secondary | ICD-10-CM

## 2013-10-05 DIAGNOSIS — E039 Hypothyroidism, unspecified: Secondary | ICD-10-CM

## 2013-10-05 NOTE — Progress Notes (Signed)
Patient ID: Crystal Hodge, female   DOB: 03/14/1946, 67 y.o.   MRN: [id number]  Reason for Appointment: Diabetes follow-up   History of Present Illness    Diagnosis: Type 2 diabetes mellitus, date of diagnosis: 2008.   She had worsening control of her diabetes with HEMOGLOBIN A1c increased on her last visit.  She  trying to do better now with her diet and has been trying to exercise also.  Although her A1c is only slightly better she thinks she has lost weight She does not want to check her weight again. Again has checked blood sugars inconsistently and mostly in the morning She is taking Amaryl 1 mg twice a day and will still get occasional hypoglycemic symptoms at lunchtime despite getting protein in the morning Previously was on Janumet but did not want to continue because of the cost  She thinks her blood sugars may have been higher about 2-3 weeks ago when she had bronchitis  Complications: peripheral neuropathy.  Monitors blood glucose: Less than once a day.  Glucometer: Freestyle.  Blood Glucose readings: Checking mostly fasting with a range of 104-140; may be 67, 87, afternoon 172, 189, no readings in the evening  Hypoglycemia frequency: She feels her blood sugar getting low about 4 hours after breakfast periodically.  Meals: Recently eating less carbohydrate portions and sweets. Has started eating eggs with her breakfast  Physical activity: exercise: Gym once a week; playing tennis, walking.   Dietician visit: Most recent:5/11.    Lab Results  Component Value Date   HGBA1C 7.2* 10/02/2013   HGBA1C 7.5* 06/13/2013   Lab Results  Component Value Date   MICROALBUR 0.3 10/02/2013   LDLCALC 95 10/02/2013   CREATININE 0.8 10/02/2013      HYPERTENSION:  blood pressure is well controlled with using Lotensin 10 mg  HYPERLIPIDEMIA:         The lipid abnormality consists of elevated LDL  and low HDL   Lab Results  Component Value Date   CHOL 162 10/02/2013   CHOL 179  06/13/2013   Lab Results  Component Value Date   HDL 38.30* 10/02/2013   HDL 39.10 06/13/2013   Lab Results  Component Value Date   LDLCALC 95 10/02/2013   LDLCALC 107* 06/13/2013   Lab Results  Component Value Date   TRIG 142.0 10/02/2013   TRIG 163.0* 06/13/2013   Lab Results  Component Value Date   CHOLHDL 4 10/02/2013   CHOLHDL 5 06/13/2013   No results found for this basename: LDLDIRECT    HYPOTHYROIDISM: She is compliant with her medication and has no unusual fatigue. TSH is normal with 100 mcg levothyroxine  Lab Results  Component Value Date   TSH 2.34 10/02/2013      Appointment on 10/02/2013  Component Date Value Range Status  . Cholesterol 10/02/2013 162  0 - 200 mg/dL Final   ATP III Classification       Desirable:  < 200 mg/dL               Borderline High:  200 - 239 mg/dL          High:  > = 956 mg/dL  . Triglycerides 10/02/2013 142.0  0.0 - 149.0 mg/dL Final   Normal:  <213 mg/dLBorderline High:  150 - 199 mg/dL  . HDL 10/02/2013 38.30* >39.00 mg/dL Final  . VLDL 08/65/7846 28.4  0.0 - 40.0 mg/dL Final  . LDL Cholesterol 10/02/2013 95  0 - 99 mg/dL Final  .  Total CHOL/HDL Ratio 10/02/2013 4   Final                  Men          Women1/2 Average Risk     3.4          3.3Average Risk          5.0          4.42X Average Risk          9.6          7.13X Average Risk          15.0          11.0                      . Hemoglobin A1C 10/02/2013 7.2* 4.6 - 6.5 % Final   Glycemic Control Guidelines for People with Diabetes:Non Diabetic:  <6%Goal of Therapy: <7%Additional Action Suggested:  >8%   . Sodium 10/02/2013 139  135 - 145 mEq/L Final  . Potassium 10/02/2013 3.9  3.5 - 5.1 mEq/L Final  . Chloride 10/02/2013 105  96 - 112 mEq/L Final  . CO2 10/02/2013 26  19 - 32 mEq/L Final  . Glucose, Bld 10/02/2013 131* 70 - 99 mg/dL Final  . BUN 16/08/9603 17  6 - 23 mg/dL Final  . Creatinine, Ser 10/02/2013 0.8  0.4 - 1.2 mg/dL Final  . Total Bilirubin 10/02/2013 0.7   0.3 - 1.2 mg/dL Final  . Alkaline Phosphatase 10/02/2013 38* 39 - 117 U/L Final  . AST 10/02/2013 30  0 - 37 U/L Final  . ALT 10/02/2013 45* 0 - 35 U/L Final  . Total Protein 10/02/2013 6.6  6.0 - 8.3 g/dL Final  . Albumin 54/07/8118 3.9  3.5 - 5.2 g/dL Final  . Calcium 14/78/2956 9.0  8.4 - 10.5 mg/dL Final  . GFR 21/30/8657 79.86  >60.00 mL/min Final  . Microalb, Ur 10/02/2013 0.3  0.0 - 1.9 mg/dL Final  . Creatinine,U 84/69/6295 123.7   Final  . Microalb Creat Ratio 10/02/2013 0.2  0.0 - 30.0 mg/g Final  . TSH 10/02/2013 2.34  0.35 - 5.50 uIU/mL Final  . Free T4 10/02/2013 0.95  0.60 - 1.60 ng/dL Final  . Color, Urine 28/41/3244 LT. YELLOW  Yellow;Lt. Yellow Final  . APPearance 10/02/2013 CLOUDY  Clear Final  . Specific Gravity, Urine 10/02/2013 1.025  1.000-1.030 Final  . pH 10/02/2013 6.0  5.0 - 8.0 Final  . Total Protein, Urine 10/02/2013 NEGATIVE  Negative Final  . Urine Glucose 10/02/2013 NEGATIVE  Negative Final  . Ketones, ur 10/02/2013 NEGATIVE  Negative Final  . Bilirubin Urine 10/02/2013 NEGATIVE  Negative Final  . Hgb urine dipstick 10/02/2013 NEGATIVE  Negative Final  . Urobilinogen, UA 10/02/2013 0.2  0.0 - 1.0 Final  . Leukocytes, UA 10/02/2013 SMALL  Negative Final  . Nitrite 10/02/2013 NEGATIVE  Negative Final  . WBC, UA 10/02/2013 0-2/hpf  0-2/hpf Final  . RBC / HPF 10/02/2013 0-2/hpf  0-2/hpf Final  . Squamous Epithelial / LPF 10/02/2013 Few(5-10/hpf)  Rare(0-4/hpf) Final  . Bacteria, UA 10/02/2013 Rare(<10/hpf)  None Final      Medication List       This list is accurate as of: 10/05/13 11:27 AM.  Always use your most recent med list.               aspirin 81 MG tablet  Take 81 mg by mouth every morning.  atorvastatin 20 MG tablet  Commonly known as:  LIPITOR  Take 20 mg by mouth every evening.     benazepril 10 MG tablet  Commonly known as:  LOTENSIN  Take 1 tablet (10 mg total) by mouth daily.     Co Q 10 10 MG Caps  Take 100 mg by mouth  every morning.     docusate sodium 100 MG capsule  Commonly known as:  COLACE  Take 100 mg by mouth 2 (two) times daily.     Fiber Caps  Take 1 capsule by mouth every morning.     Fish Oil 1200 MG Caps  Take 1,200 mg by mouth every morning.     glimepiride 1 MG tablet  Commonly known as:  AMARYL  TAKE ONE TABLET BY MOUTH TWICE DAILY     glucose blood test strip  Commonly known as:  FREESTYLE LITE  Use as instructed to check blood sugars 2 times per day dx code 250.00     glucose blood test strip     levothyroxine 100 MCG tablet  Commonly known as:  SYNTHROID, LEVOTHROID  Take 1 tablet (100 mcg total) by mouth daily.     metFORMIN 1000 MG tablet  Commonly known as:  GLUCOPHAGE  Take 1,000 mg by mouth 2 (two) times daily.     mupirocin ointment 2 %  Commonly known as:  BACTROBAN  Apply topically daily. To nares via clean qtip x 7 days prn nasal lesions at bedtime     NON FORMULARY  1 tsp honey with Manuka every morning.     PROAIR HFA 108 (90 BASE) MCG/ACT inhaler  Generic drug:  albuterol     triamterene-hydrochlorothiazide 37.5-25 MG per capsule  Commonly known as:  DYAZIDE  Take 1 capsule by mouth daily.     Vitamin D3 2000 UNITS Tabs  Take 2,000 Units by mouth every morning.        Allergies:  Allergies  Allergen Reactions  . Amoxicillin Diarrhea  . Prednisone     Hallucinations- can't take oral    Past Medical History  Diagnosis Date  . Diabetes mellitus     type II  . Hypertension   . Hyperlipidemia   . Thyroid disease   . Hypothyroidism   . Rectocele   . Menopause   . Obesity   . Tremor 04/07/2011  . Staph infection 09/12/2013  . Rectocele 09/12/2013  . Preventative health care 09/16/2013  . Acute upper respiratory infections of unspecified site 09/16/2013    Past Surgical History  Procedure Laterality Date  . Abdominal hysterectomy  07/28/95  . Carpal tunnel release  10/07    Right    Family History  Problem Relation Age of Onset   . Hyperlipidemia Mother   . Hyperlipidemia Father   . Heart disease Father     triple bypass  . Diabetes Son     ?type I    Social History:  reports that she quit smoking about 22 years ago. She has never used smokeless tobacco. She reports that she drinks alcohol. She reports that she does not use illicit drugs.  Review of Systems   She has long-standing hypothyroidism, has no unusual fatigue and is compliant with her medication TSH is now normal Lab Results  Component Value Date   TSH 2.34 10/02/2013   Hypertension: Is well controlled and no history of microalbuminuria  No history of numbness or tingling/burning in her feet  HYPERLIPIDEMIA: Her LDL was 107 on  the last visit and she has tried to improve her diet, this is now improved. Is compliant with her Lipitor   Examination:   BP 116/70  Pulse 83  Temp(Src) 98.3 F (36.8 C)  Resp 12  Ht 5\' 10"  (1.778 m)  SpO2 96%  Body mass index is 0.00 kg/(m^2).   Foot exam done  Assesment:   1. Diabetes type 2, uncontrolled - 250.02  The patient's diabetes control appears to be relatively better with A1c near 7% This is with trying to improve her diet since her last visit Also tolerating maximum dose metformin now with extended release preparation  2. Hypothyroidism: Adequately replaced with normal TSH, symptomatically is doing well and will continue same dose  3. Hyperlipidemia: LDL is back at goal with improved diet and she will not need to increase her Lipitor.   Hypertension: Currently controlled  with Lotensin  PLAN:   1. To check home blood sugars more consistently  2 hours after any meal  2. Stop the Amaryl in the morning and  take 2 mg in the evening, call if blood sugars not well controlled. May need only 1 mg in the evening 3. Consider going back to Janumet next year, maybe 4 to lose weight better with this 4. Continue other medications unchanged 5. Regular eye exams  Counseling time over 50% of today's  25 minute visit  Eaton Folmar 10/05/2013, 11:27 AM

## 2013-10-05 NOTE — Patient Instructions (Addendum)
Take 2mg  Glimeperide at dinner only and none in the morning If still getting low sugars during the day reduce to 1 mg in pm only  Please check blood sugars at least half the time about 2 hours after any meal and as directed on waking up.  Please bring blood sugar monitor to each visit  Continue same medications and regular exercise program

## 2013-11-14 ENCOUNTER — Ambulatory Visit (INDEPENDENT_AMBULATORY_CARE_PROVIDER_SITE_OTHER): Payer: Medicare Other | Admitting: Family Medicine

## 2013-11-14 ENCOUNTER — Telehealth: Payer: Self-pay

## 2013-11-14 ENCOUNTER — Other Ambulatory Visit: Payer: Medicare Other

## 2013-11-14 ENCOUNTER — Encounter: Payer: Self-pay | Admitting: Family Medicine

## 2013-11-14 VITALS — BP 112/70 | HR 67 | Temp 98.4°F | Ht 70.0 in

## 2013-11-14 DIAGNOSIS — E119 Type 2 diabetes mellitus without complications: Secondary | ICD-10-CM

## 2013-11-14 DIAGNOSIS — E785 Hyperlipidemia, unspecified: Secondary | ICD-10-CM

## 2013-11-14 DIAGNOSIS — R0600 Dyspnea, unspecified: Secondary | ICD-10-CM

## 2013-11-14 DIAGNOSIS — R0683 Snoring: Secondary | ICD-10-CM

## 2013-11-14 DIAGNOSIS — Z1211 Encounter for screening for malignant neoplasm of colon: Secondary | ICD-10-CM

## 2013-11-14 DIAGNOSIS — I10 Essential (primary) hypertension: Secondary | ICD-10-CM

## 2013-11-14 DIAGNOSIS — R0989 Other specified symptoms and signs involving the circulatory and respiratory systems: Secondary | ICD-10-CM

## 2013-11-14 DIAGNOSIS — R0609 Other forms of dyspnea: Secondary | ICD-10-CM

## 2013-11-14 DIAGNOSIS — R0681 Apnea, not elsewhere classified: Secondary | ICD-10-CM

## 2013-11-14 MED ORDER — ALBUTEROL SULFATE HFA 108 (90 BASE) MCG/ACT IN AERS: 2.0000 | INHALATION_SPRAY | Freq: Four times a day (QID) | RESPIRATORY_TRACT | Status: DC | PRN

## 2013-11-14 NOTE — Patient Instructions (Signed)

## 2013-11-14 NOTE — Progress Notes (Signed)
Pre visit review using our clinic review tool, if applicable. No additional management support is needed unless otherwise documented below in the visit note. 

## 2013-11-14 NOTE — Telephone Encounter (Signed)
Message copied by Court Joy on Tue Nov 14, 2013 12:12 PM ------      Message from: Dedra Skeens      Created: Tue Nov 14, 2013 11:58 AM       Clydie Braun from the [facility] lab called stating that she needs an IFOB order put in for patient ------

## 2013-11-14 NOTE — Telephone Encounter (Signed)
Message copied by Court Joy on Tue Nov 14, 2013 12:18 PM ------      Message from: Dedra Skeens      Created: Tue Nov 14, 2013 11:58 AM       Clydie Braun from the [facility] lab called stating that she needs an IFOB order put in for patient ------

## 2013-11-15 ENCOUNTER — Telehealth: Payer: Self-pay | Admitting: Family Medicine

## 2013-11-15 ENCOUNTER — Ambulatory Visit (HOSPITAL_COMMUNITY)
Admission: RE | Admit: 2013-11-15 | Discharge: 2013-11-15 | Disposition: A | Discharge status: Home / Self Care | Payer: Medicare Other | Source: Ambulatory Visit | Attending: Family Medicine | Admitting: Family Medicine

## 2013-11-15 DIAGNOSIS — R0609 Other forms of dyspnea: Secondary | ICD-10-CM | POA: Insufficient documentation

## 2013-11-15 DIAGNOSIS — I517 Cardiomegaly: Secondary | ICD-10-CM

## 2013-11-15 DIAGNOSIS — R0600 Dyspnea, unspecified: Secondary | ICD-10-CM

## 2013-11-15 DIAGNOSIS — R0989 Other specified symptoms and signs involving the circulatory and respiratory systems: Secondary | ICD-10-CM | POA: Insufficient documentation

## 2013-11-15 NOTE — Telephone Encounter (Signed)
PA form faxed over for Ventolin HFA 108(90Base)MCG/ACT Aerosols, Form forward to nurse

## 2013-11-15 NOTE — Progress Notes (Signed)
Echo Lab  2D Echocardiogram completed.  Nicloe Frontera L Rilla Buckman, RDCS 11/15/2013 11:24 AM

## 2013-11-17 ENCOUNTER — Telehealth: Payer: Self-pay | Admitting: Internal Medicine

## 2013-11-17 NOTE — Telephone Encounter (Signed)
I called and spoke with pt. i advised her Dr. Maple Hudson did not have any sooner appts available. She will keep what she is already scheduled for and call back another date to see if we have any cancellations. Nothing further needed

## 2013-11-19 ENCOUNTER — Encounter: Payer: Self-pay | Admitting: Family Medicine

## 2013-11-19 DIAGNOSIS — R0600 Dyspnea, unspecified: Secondary | ICD-10-CM | POA: Insufficient documentation

## 2013-11-19 HISTORY — DX: Dyspnea, unspecified: R06.00

## 2013-11-19 NOTE — Assessment & Plan Note (Signed)
Tolerating Atorvasttain well controlled

## 2013-11-19 NOTE — Progress Notes (Signed)
Crystal Hodge [id number] [birth year] 11/19/2013      Progress Note-Follow Up  Subjective  Chief Complaint  Chief Complaint  Patient presents with  . check her breathing    HPI  Today to discuss shortness of breath. She notes with exertion her companion Center soft benign and she gets short of breath more easily. She describes some pursed lip breathing at times. It is worse with exertion and does not notice the symptoms at rest. Denies chest pain or palpitations. Denies fatigue or malaise or myalgias. So further bleeding has not limited her activity but she feels it may. No fevers or chills. No persistent cough or other acute complaints at this time.  Past Medical History  Diagnosis Date  . Diabetes mellitus     type II  . Hypertension   . Hyperlipidemia   . Thyroid disease   . Hypothyroidism   . Rectocele   . Menopause   . Obesity   . Tremor 04/07/2011  . Staph infection 09/12/2013  . Rectocele 09/12/2013  . Preventative health care 09/16/2013  . Acute upper respiratory infections of unspecified site 09/16/2013  . Dyspnea 11/19/2013    Past Surgical History  Procedure Laterality Date  . Abdominal hysterectomy  07/28/95  . Carpal tunnel release  10/07    Right    Family History  Problem Relation Age of Onset  . Hyperlipidemia Mother   . Hyperlipidemia Father   . Heart disease Father     triple bypass  . Diabetes Son     ?type I    History   Social History  . Marital Status: Widowed    Spouse Name: N/A    Number of Children: 2  . Years of Education: HS   Occupational History  . Retired    Social History Main Topics  . Smoking status: Former Smoker    Quit date: 11/23/1990  . Smokeless tobacco: Never Used  . Alcohol Use: Yes     Comment: occasional  . Drug Use: No  . Sexual Activity: Not Currently    Partners: Male   Other Topics Concern  . Not on file   Social History Narrative   Caffeine: 1 mug coffee daily   Regular exercise: plays tennis.          Current Outpatient Prescriptions on File Prior to Visit  Medication Sig Dispense Refill  . aspirin 81 MG tablet Take 81 mg by mouth every morning.        Marland Kitchen atorvastatin (LIPITOR) 20 MG tablet Take 20 mg by mouth every evening.       . benazepril (LOTENSIN) 10 MG tablet Take 1 tablet (10 mg total) by mouth daily.  30 tablet  5  . Cholecalciferol (VITAMIN D3) 2000 UNITS TABS Take 2,000 Units by mouth every morning.      . Coenzyme Q10 (CO Q 10) 10 MG CAPS Take 100 mg by mouth every morning.        . docusate sodium (COLACE) 100 MG capsule Take 100 mg by mouth 2 (two) times daily.        . Fiber CAPS Take 1 capsule by mouth every morning.        Marland Kitchen glucose blood (FREESTYLE LITE) test strip Use as instructed to check blood sugars 2 times per day dx code 250.00  100 each  12  . glucose blood test strip       . levothyroxine (SYNTHROID, LEVOTHROID) 100 MCG tablet Take 1 tablet (100 mcg total)  by mouth daily.  30 tablet  5  . metFORMIN (GLUCOPHAGE) 1000 MG tablet Take 1,000 mg by mouth 2 (two) times daily.      . mupirocin ointment (BACTROBAN) 2 % Apply topically daily. To nares via clean qtip x 7 days prn nasal lesions at bedtime  22 g  0  . NON FORMULARY 1 tsp honey with Manuka every morning.       . Omega-3 Fatty Acids (FISH OIL) 1200 MG CAPS Take 1,200 mg by mouth every morning.      Marland Kitchen PROAIR HFA 108 (90 BASE) MCG/ACT inhaler       . triamterene-hydrochlorothiazide (DYAZIDE) 37.5-25 MG per capsule Take 1 capsule by mouth daily.       No current facility-administered medications on file prior to visit.    Allergies  Allergen Reactions  . Amoxicillin Diarrhea  . Prednisone     Hallucinations- can't take oral    Review of Systems  Review of Systems  Constitutional: Negative for fever and malaise/fatigue.  HENT: Negative for congestion.   Eyes: Negative for discharge.  Respiratory: Negative for shortness of breath.   Cardiovascular: Negative for chest pain, palpitations and leg  swelling.  Gastrointestinal: Negative for nausea, abdominal pain and diarrhea.  Genitourinary: Negative for dysuria.  Musculoskeletal: Negative for falls.  Skin: Negative for rash.  Neurological: Negative for loss of consciousness and headaches.  Endo/Heme/Allergies: Negative for polydipsia.  Psychiatric/Behavioral: Negative for depression and suicidal ideas. The patient is not nervous/anxious and does not have insomnia.     Objective  BP 112/70  Pulse 67  Temp(Src) 98.4 F (36.9 C) (Oral)  Ht 5\' 10"  (1.778 m)  SpO2 95%  Physical Exam  Physical Exam  Constitutional: She is oriented to person, place, and time and well-developed, well-nourished, and in no distress. No distress.  HENT:  Head: Normocephalic and atraumatic.  Eyes: Conjunctivae are normal.  Neck: Neck supple. No thyromegaly present.  Cardiovascular: Normal rate, regular rhythm and normal heart sounds.   No murmur heard. Pulmonary/Chest: Effort normal and breath sounds normal. She has no wheezes.  Abdominal: She exhibits no distension and no mass.  Musculoskeletal: She exhibits no edema.  Lymphadenopathy:    She has no cervical adenopathy.  Neurological: She is alert and oriented to person, place, and time.  Skin: Skin is warm and dry. No rash noted. She is not diaphoretic.  Psychiatric: Memory, affect and judgment normal.    Lab Results  Component Value Date   TSH 2.34 10/02/2013   Lab Results  Component Value Date   WBC 11.0* 05/14/2011   HGB 13.4 05/14/2011   HCT 43.7 05/14/2011   MCV 80.8 05/14/2011   PLT 361 05/14/2011   Lab Results  Component Value Date   CREATININE 0.8 10/02/2013   BUN 17 10/02/2013   NA 139 10/02/2013   K 3.9 10/02/2013   CL 105 10/02/2013   CO2 26 10/02/2013   Lab Results  Component Value Date   ALT 45* 10/02/2013   AST 30 10/02/2013   ALKPHOS 38* 10/02/2013   BILITOT 0.7 10/02/2013   Lab Results  Component Value Date   CHOL 162 10/02/2013   Lab Results  Component  Value Date   HDL 38.30* 10/02/2013   Lab Results  Component Value Date   LDLCALC 95 10/02/2013   Lab Results  Component Value Date   TRIG 142.0 10/02/2013   Lab Results  Component Value Date   CHOLHDL 4 10/02/2013     Assessment &  Plan  HTN (hypertension) Well controlled, no changes  Dyspnea Worsening with exertion mostly. Is beginning to limit activity. Will refer to pulmonoloyg and proceed with 2D echo to evaluate.   Hyperlipidemia Tolerating Atorvasttain well controlled  DM2 (diabetes mellitus, type 2) hgba1c improving, aovid simple carbs no change in meds

## 2013-11-19 NOTE — Assessment & Plan Note (Signed)
hgba1c improving, aovid simple carbs no change in meds

## 2013-11-19 NOTE — Assessment & Plan Note (Signed)
Worsening with exertion mostly. Is beginning to limit activity. Will refer to pulmonoloyg and proceed with 2D echo to evaluate.

## 2013-11-19 NOTE — Assessment & Plan Note (Signed)
Well controlled, no changes 

## 2013-11-20 MED ORDER — PROAIR HFA 108 (90 BASE) MCG/ACT IN AERS: 2.0000 | INHALATION_SPRAY | Freq: Four times a day (QID) | RESPIRATORY_TRACT | Status: DC | PRN

## 2013-11-20 NOTE — Telephone Encounter (Signed)
I called patient and informed her and she stated that she thinks the insurance will cover ProAir and to please send that to the pharmacy.  RX sent

## 2013-11-20 NOTE — Telephone Encounter (Signed)
We are not proceeding with the PA at this time

## 2013-11-20 NOTE — Telephone Encounter (Signed)
Per MD have pt call insurance to see if insurance will cover ProAir or Proventil

## 2013-11-21 ENCOUNTER — Encounter: Payer: Self-pay | Admitting: Family Medicine

## 2013-11-21 ENCOUNTER — Ambulatory Visit (INDEPENDENT_AMBULATORY_CARE_PROVIDER_SITE_OTHER): Payer: Medicare Other | Admitting: Family Medicine

## 2013-11-21 VITALS — BP 120/84 | HR 64 | Temp 98.3°F | Ht 70.0 in

## 2013-11-21 DIAGNOSIS — H6692 Otitis media, unspecified, left ear: Secondary | ICD-10-CM

## 2013-11-21 DIAGNOSIS — H6690 Otitis media, unspecified, unspecified ear: Secondary | ICD-10-CM

## 2013-11-21 DIAGNOSIS — I10 Essential (primary) hypertension: Secondary | ICD-10-CM

## 2013-11-21 DIAGNOSIS — R05 Cough: Secondary | ICD-10-CM

## 2013-11-21 MED ORDER — CEFDINIR 300 MG PO CAPS: 300.0000 mg | ORAL_CAPSULE | Freq: Two times a day (BID) | ORAL | Status: AC

## 2013-11-21 NOTE — Assessment & Plan Note (Signed)
Cefdinir 300 mg po bid and report if no improvement

## 2013-11-21 NOTE — Assessment & Plan Note (Signed)
Actually somewhat improved.

## 2013-11-21 NOTE — Assessment & Plan Note (Signed)
Well controlled despite acute illness, no changes

## 2013-11-21 NOTE — Progress Notes (Signed)
Patient ID: Crystal Hodge, female   DOB: 01/30/1946, 67 y.o.   MRN: [id number] [facility] [id number] 03-Jul-1946 11/21/2013      Progress Note-Follow Up  Subjective  Chief Complaint  Chief Complaint  Patient presents with  . congestion    chest and head X 1.5 weeks  . Otalgia    left ear X 2 days    HPI  Patient is a 67 year old Caucasian female who is in today with worsening head congestion and ear pain. She's been struggling with intermittent bronchitis but over the last 2 days has had increased congestion in her head as well as increased ear pain. Has some mild malaise and myalgias but no fevers or chills. Her cough is actually somewhat improved. She denies chest pain or palpitations. No GI or GU concerns.  Past Medical History  Diagnosis Date  . Diabetes mellitus     type II  . Hypertension   . Hyperlipidemia   . Thyroid disease   . Hypothyroidism   . Rectocele   . Menopause   . Obesity   . Tremor 04/07/2011  . Staph infection 09/12/2013  . Rectocele 09/12/2013  . Preventative health care 09/16/2013  . Acute upper respiratory infections of unspecified site 09/16/2013  . Dyspnea 11/19/2013    Past Surgical History  Procedure Laterality Date  . Abdominal hysterectomy  07/28/95  . Carpal tunnel release  10/07    Right    Family History  Problem Relation Age of Onset  . Hyperlipidemia Mother   . Hyperlipidemia Father   . Heart disease Father     triple bypass  . Diabetes Son     ?type I    History   Social History  . Marital Status: Widowed    Spouse Name: N/A    Number of Children: 2  . Years of Education: HS   Occupational History  . Retired    Social History Main Topics  . Smoking status: Former Smoker    Quit date: 11/23/1990  . Smokeless tobacco: Never Used  . Alcohol Use: Yes     Comment: occasional  . Drug Use: No  . Sexual Activity: Not Currently    Partners: Male   Other Topics Concern  . Not on file   Social History Narrative   Caffeine: 1 mug coffee daily   Regular exercise: plays tennis.          Current Outpatient Prescriptions on File Prior to Visit  Medication Sig Dispense Refill  . aspirin 81 MG tablet Take 81 mg by mouth every morning.        Marland Kitchen atorvastatin (LIPITOR) 20 MG tablet Take 20 mg by mouth every evening.       . benazepril (LOTENSIN) 10 MG tablet Take 1 tablet (10 mg total) by mouth daily.  30 tablet  5  . Cholecalciferol (VITAMIN D3) 2000 UNITS TABS Take 2,000 Units by mouth every morning.      . Coenzyme Q10 (CO Q 10) 10 MG CAPS Take 100 mg by mouth every morning.        . docusate sodium (COLACE) 100 MG capsule Take 100 mg by mouth 2 (two) times daily.        . Fiber CAPS Take 1 capsule by mouth every morning.        Marland Kitchen glimepiride (AMARYL) 1 MG tablet TAKE ONE TABLET BY MOUTH DAILY      . glucose blood (FREESTYLE LITE) test strip Use as instructed to check  blood sugars 2 times per day dx code 250.00  100 each  12  . glucose blood test strip       . levothyroxine (SYNTHROID, LEVOTHROID) 100 MCG tablet Take 1 tablet (100 mcg total) by mouth daily.  30 tablet  5  . metFORMIN (GLUCOPHAGE) 1000 MG tablet Take 1,000 mg by mouth 2 (two) times daily.      . mupirocin ointment (BACTROBAN) 2 % Apply topically daily. To nares via clean qtip x 7 days prn nasal lesions at bedtime  22 g  0  . NON FORMULARY 1 tsp honey with Manuka every morning.       . Omega-3 Fatty Acids (FISH OIL) 1200 MG CAPS Take 1,200 mg by mouth every morning.      Marland Kitchen PROAIR HFA 108 (90 BASE) MCG/ACT inhaler Inhale 2 puffs into the lungs every 6 (six) hours as needed for wheezing or shortness of breath.  1 Inhaler  2  . triamterene-hydrochlorothiazide (DYAZIDE) 37.5-25 MG per capsule Take 1 capsule by mouth daily.       No current facility-administered medications on file prior to visit.    Allergies  Allergen Reactions  . Amoxicillin Diarrhea  . Prednisone     Hallucinations- can't take oral    Review of Systems  Review of  Systems  Constitutional: Negative for fever and malaise/fatigue.  HENT: Negative for congestion.   Eyes: Negative for discharge.  Respiratory: Negative for shortness of breath.   Cardiovascular: Negative for chest pain, palpitations and leg swelling.  Gastrointestinal: Negative for nausea, abdominal pain and diarrhea.  Genitourinary: Negative for dysuria.  Musculoskeletal: Negative for falls.  Skin: Negative for rash.  Neurological: Negative for loss of consciousness and headaches.  Endo/Heme/Allergies: Negative for polydipsia.  Psychiatric/Behavioral: Negative for depression and suicidal ideas. The patient is not nervous/anxious and does not have insomnia.     Objective  BP 120/84  Pulse 64  Temp(Src) 98.3 F (36.8 C) (Oral)  Ht 5\' 10"  (1.778 m)  SpO2 97%  Physical Exam  Physical Exam  Constitutional: She is oriented to person, place, and time and well-developed, well-nourished, and in no distress. No distress.  HENT:  Head: Normocephalic and atraumatic.  Left TM dull, erythematous and retracted  Eyes: Conjunctivae are normal.  Neck: Neck supple. No thyromegaly present.  Cardiovascular: Normal rate, regular rhythm and normal heart sounds.   No murmur heard. Pulmonary/Chest: Effort normal and breath sounds normal. She has no wheezes.  Abdominal: She exhibits no distension and no mass.  Musculoskeletal: She exhibits no edema.  Lymphadenopathy:    She has no cervical adenopathy.  Neurological: She is alert and oriented to person, place, and time.  Skin: Skin is warm and dry. No rash noted. She is not diaphoretic.  Psychiatric: Memory, affect and judgment normal.    Lab Results  Component Value Date   TSH 2.34 10/02/2013   Lab Results  Component Value Date   WBC 11.0* 05/14/2011   HGB 13.4 05/14/2011   HCT 43.7 05/14/2011   MCV 80.8 05/14/2011   PLT 361 05/14/2011   Lab Results  Component Value Date   CREATININE 0.8 10/02/2013   BUN 17 10/02/2013   NA 139  10/02/2013   K 3.9 10/02/2013   CL 105 10/02/2013   CO2 26 10/02/2013   Lab Results  Component Value Date   ALT 45* 10/02/2013   AST 30 10/02/2013   ALKPHOS 38* 10/02/2013   BILITOT 0.7 10/02/2013   Lab Results  Component  Value Date   CHOL 162 10/02/2013   Lab Results  Component Value Date   HDL 38.30* 10/02/2013   Lab Results  Component Value Date   LDLCALC 95 10/02/2013   Lab Results  Component Value Date   TRIG 142.0 10/02/2013   Lab Results  Component Value Date   CHOLHDL 4 10/02/2013     Assessment & Plan  Cough Actually somewhat improved.   HTN (hypertension) Well controlled despite acute illness, no changes  Otitis media Cefdinir 300 mg po bid and report if no improvement

## 2013-11-21 NOTE — Progress Notes (Signed)
Pre visit review using our clinic review tool, if applicable. No additional management support is needed unless otherwise documented below in the visit note. 

## 2013-11-21 NOTE — Patient Instructions (Signed)

## 2013-11-28 ENCOUNTER — Ambulatory Visit (INDEPENDENT_AMBULATORY_CARE_PROVIDER_SITE_OTHER): Payer: Medicare Other | Admitting: Cardiovascular Disease

## 2013-11-28 ENCOUNTER — Encounter: Payer: Self-pay | Admitting: Cardiovascular Disease

## 2013-11-28 VITALS — BP 104/60 | HR 62 | Ht 70.0 in | Wt 231.0 lb

## 2013-11-28 DIAGNOSIS — I10 Essential (primary) hypertension: Secondary | ICD-10-CM

## 2013-11-28 DIAGNOSIS — R0989 Other specified symptoms and signs involving the circulatory and respiratory systems: Secondary | ICD-10-CM

## 2013-11-28 DIAGNOSIS — E785 Hyperlipidemia, unspecified: Secondary | ICD-10-CM

## 2013-11-28 DIAGNOSIS — R0600 Dyspnea, unspecified: Secondary | ICD-10-CM

## 2013-11-28 DIAGNOSIS — R0609 Other forms of dyspnea: Secondary | ICD-10-CM

## 2013-11-28 NOTE — Progress Notes (Signed)
Crystal Hodge Date of Birth  02-02-1946       [facility]    [facility] [address]  [address], [address] [place]  [address]   [place], [place]  [id number] [phone]     [phone]   Fax  [phone]    Fax [phone]  Problem List: 1. Diabetes mellitus 2. Hypertension 3. Hyperlipidemia 4. Hypothyroidism  History of Present Illness:  Crystal Hodge is seen again after 3-1/2 year absence. I last saw her when her husband, Crystal Hodge, passed away.  We performed a stress test possibly 4-5 years ago. It was unremarkable.  She now presents for further evaluation of shortness of breath-particularly when she is active. She does notice that she is more short of breath playing tennis recently. She has also noted shortness of breath with ambulation.  She short of breath walking up a hill or climbing stairs. She is able to come to 3 flights of stairs.  No angina like CP .   She works out with a Psychologist, educationaltrainer.  She also notes some occasional palpitations. These palpitations are fairly brief in may of last 6 or so. These palpitations are not associated with her shortness of breath and are not associated with exercise.  She has some symptoms of sleep apnea.     Current Outpatient Prescriptions on File Prior to Visit  Medication Sig Dispense Refill  . aspirin 81 MG tablet Take 81 mg by mouth every morning.        Marland Kitchen. atorvastatin (LIPITOR) 20 MG tablet Take 20 mg by mouth every evening.       . benazepril (LOTENSIN) 10 MG tablet Take 1 tablet (10 mg total) by mouth daily.  30 tablet  5  . cefdinir (OMNICEF) 300 MG capsule Take 1 capsule (300 mg total) by mouth 2 (two) times daily.  20 capsule  0  . Cholecalciferol (VITAMIN D3) 2000 UNITS TABS Take 2,000 Units by mouth every morning.      . Coenzyme Q10 (CO Q 10) 10 MG CAPS Take 100 mg by mouth every morning.        . docusate sodium (COLACE) 100 MG capsule Take 100 mg by mouth 2 (two) times daily.        .  Fiber CAPS Take 1 capsule by mouth every morning.        Marland Kitchen. glimepiride (AMARYL) 1 MG tablet TAKE ONE TABLET BY MOUTH DAILY      . glucose blood (FREESTYLE LITE) test strip Use as instructed to check blood sugars 2 times per day dx code 250.00  100 each  12  . glucose blood test strip       . levothyroxine (SYNTHROID, LEVOTHROID) 100 MCG tablet Take 1 tablet (100 mcg total) by mouth daily.  30 tablet  5  . metFORMIN (GLUCOPHAGE) 1000 MG tablet Take 1,000 mg by mouth 2 (two) times daily.      . mupirocin ointment (BACTROBAN) 2 % Apply topically daily. To nares via clean qtip x 7 days prn nasal lesions at bedtime  22 g  0  . NON FORMULARY 1 tsp honey with Manuka every morning.       . Omega-3 Fatty Acids (FISH OIL) 1200 MG CAPS Take 1,200 mg by mouth every morning.      Marland Kitchen. PROAIR HFA 108 (90 BASE) MCG/ACT inhaler Inhale 2 puffs into the lungs every 6 (six) hours as needed for wheezing or shortness of breath.  1 Inhaler  2  . triamterene-hydrochlorothiazide (DYAZIDE) 37.5-25 MG per capsule Take 1 capsule by mouth daily.       No current facility-administered medications on file prior to visit.    Allergies  Allergen Reactions  . Amoxicillin Diarrhea  . Prednisone     Hallucinations- can't take oral    Past Medical History  Diagnosis Date  . Diabetes mellitus     type II  . Hypertension   . Hyperlipidemia   . Thyroid disease   . Hypothyroidism   . Rectocele   . Menopause   . Obesity   . Tremor 04/07/2011  . Staph infection 09/12/2013  . Rectocele 09/12/2013  . Preventative health care 09/16/2013  . Acute upper respiratory infections of unspecified site 09/16/2013  . Dyspnea 11/19/2013    Past Surgical History  Procedure Laterality Date  . Abdominal hysterectomy  07/28/95  . Carpal tunnel release  10/07    Right    History  Smoking status  . Former Smoker  . Quit date: 11/23/1990  Smokeless tobacco  . Never Used    History  Alcohol Use  . Yes    Comment: occasional     Family History  Problem Relation Age of Onset  . Hyperlipidemia Mother   . Hyperlipidemia Father   . Heart disease Father     triple bypass  . Diabetes Son     ?type I    Reviw of Systems:  Reviewed in the HPI.  All other systems are negative.  Physical Exam: Blood pressure 104/60, pulse 62, height 5\' 10"  (1.778 m), weight 231 lb (104.781 kg). General: Well developed, well nourished, in no acute distress.  Head: Normocephalic, atraumatic, sclera non-icteric, mucus membranes are moist,   Neck: Supple. Carotids are 2 + without bruits. No JVD   Lungs: Clear   Heart: RR, normal S1 s2  Abdomen: Soft, non-tender, non-distended with normal bowel sounds.  Msk:  Strength and tone are normal   Extremities: No clubbing or cyanosis. No edema.  Distal pedal pulses are 2+ and equal    Neuro: CN II - XII intact.  Alert and oriented X 3.   Psych:  Normal  ECG: Jan. 6, 2015:  NSR   Assessment / Plan:

## 2013-11-28 NOTE — Assessment & Plan Note (Signed)
Crystal NiemannJaime seen today for a return visit. I last saw her approximately 4 years ago. He continues to have shortness of breath. She's gained some weight over the years. She has not been exercising regularly but has started recently.  She's had an echocardiogram which revealed normal left ventricle systolic function.  I suspect that her shortness breath is due to deconditioning and due to her weight gain. I've recommended that she work on a good weight loss program.  We performed a stress test in the past which was normal. She does not have any episodes of angina think that the odds of her having significant coronary artery disease are quite low.  We'll see her back on an as-needed basis.

## 2013-11-28 NOTE — Patient Instructions (Signed)
Your physician recommends that you schedule a follow-up appointment in: as needed basis  

## 2013-12-14 ENCOUNTER — Encounter: Payer: Self-pay | Admitting: Internal Medicine

## 2013-12-14 ENCOUNTER — Other Ambulatory Visit: Payer: Self-pay | Admitting: *Deleted

## 2013-12-14 ENCOUNTER — Ambulatory Visit (INDEPENDENT_AMBULATORY_CARE_PROVIDER_SITE_OTHER): Payer: Medicare Other | Admitting: Internal Medicine

## 2013-12-14 VITALS — BP 140/90 | HR 85 | Ht 70.5 in | Wt 230.0 lb

## 2013-12-14 DIAGNOSIS — R259 Unspecified abnormal involuntary movements: Secondary | ICD-10-CM

## 2013-12-14 DIAGNOSIS — G4733 Obstructive sleep apnea (adult) (pediatric): Secondary | ICD-10-CM

## 2013-12-14 DIAGNOSIS — R251 Tremor, unspecified: Secondary | ICD-10-CM

## 2013-12-14 DIAGNOSIS — R0989 Other specified symptoms and signs involving the circulatory and respiratory systems: Secondary | ICD-10-CM

## 2013-12-14 DIAGNOSIS — J309 Allergic rhinitis, unspecified: Secondary | ICD-10-CM

## 2013-12-14 DIAGNOSIS — R0609 Other forms of dyspnea: Secondary | ICD-10-CM

## 2013-12-14 MED ORDER — METFORMIN HCL 1000 MG PO TABS: 1000.0000 mg | ORAL_TABLET | Freq: Two times a day (BID) | ORAL | Status: DC

## 2013-12-14 NOTE — Patient Instructions (Signed)
Order- Schedule PFT and 6 MWT   Dx dyspnea on exertion  Ask people to notice if you stop breathing much in your sleep or snore a lot.

## 2013-12-14 NOTE — Progress Notes (Signed)
12/14/13- 67 yoFwidowed former smoker COMPLAINS OF:  Referred by Dr. Reuel Derby for SOB with exertion, snoring, apnea--noticed this over the past x2 months Patient has had nasal congestion for 6 weeks. Shortness of breath with exertion for the last 2 years. These come and go. Able to play tennis. Uses pursed lip breathing. Her travels with a friend who tells her she snores and stops breathing. Bedtime between midnight and 1 AM, sleep latency 5 minutes, not aware of waking fully during the night until up around 8 or 9 AM. Weight has gone down 25 pounds in the last 2 years. Medical history includes one episode of pneumonia, and bronchitis most years in the fall. No cough associated with benazepril/ACE. Normal 2-D echocardiogram in December. CXR 09/05/13 IMPRESSION:  1. Mild thoracic spondylosis. No acute thoracic findings.  Electronically Signed  By: Herbie Baltimore M.D.  On: 09/05/2013 14:06  Prior to Admission medications   Medication Sig Start Date End Date Taking? Authorizing Provider  aspirin 81 MG tablet Take 81 mg by mouth every morning.     Yes Historical Provider, MD  atorvastatin (LIPITOR) 20 MG tablet Take 20 mg by mouth every evening.    Yes Historical Provider, MD  benazepril (LOTENSIN) 10 MG tablet Take 1 tablet (10 mg total) by mouth daily. 09/22/13  Yes Reather Littler, MD  Cholecalciferol (VITAMIN D3) 2000 UNITS TABS Take 2,000 Units by mouth every morning.   Yes Historical Provider, MD  Coenzyme Q10 (CO Q 10) 10 MG CAPS Take 100 mg by mouth every morning.     Yes Historical Provider, MD  docusate sodium (COLACE) 100 MG capsule Take 100 mg by mouth 2 (two) times daily.     Yes Historical Provider, MD  Fiber CAPS Take 1 capsule by mouth every morning.     Yes Historical Provider, MD  glimepiride (AMARYL) 1 MG tablet TAKE ONE TABLET BY MOUTH DAILY 09/10/13  Yes Reather Littler, MD  glucose blood (FREESTYLE LITE) test strip Use as instructed to check blood sugars 2 times per day dx code 250.00  09/25/13  Yes Reather Littler, MD  glucose blood test strip  09/25/13  Yes Historical Provider, MD  levothyroxine (SYNTHROID, LEVOTHROID) 100 MCG tablet Take 1 tablet (100 mcg total) by mouth daily. 09/22/13  Yes Reather Littler, MD  mupirocin ointment (BACTROBAN) 2 % Apply topically daily. To nares via clean qtip x 7 days prn nasal lesions at bedtime 09/12/13  Yes Bradd Canary, MD  NON FORMULARY 1 tsp honey with Manuka every morning.    Yes Historical Provider, MD  Omega-3 Fatty Acids (FISH OIL) 1200 MG CAPS Take 1,200 mg by mouth every morning.   Yes Historical Provider, MD  PROAIR HFA 108 (90 BASE) MCG/ACT inhaler Inhale 2 puffs into the lungs every 6 (six) hours as needed for wheezing or shortness of breath. 11/20/13  Yes Bradd Canary, MD  triamterene-hydrochlorothiazide (DYAZIDE) 37.5-25 MG per capsule Take 1 capsule by mouth daily.   Yes Historical Provider, MD  doxycycline (VIBRA-TABS) 100 MG tablet Take 1 tablet (100 mg total) by mouth 2 (two) times daily. 12/22/13   Bradd Canary, MD  metFORMIN (GLUCOPHAGE) 1000 MG tablet Take 1 tablet (1,000 mg total) by mouth 2 (two) times daily. 12/14/13   Reather Littler, MD   Past Medical History  Diagnosis Date  . Diabetes mellitus     type II  . Hypertension   . Hyperlipidemia   . Thyroid disease   . Hypothyroidism   .  Rectocele   . Menopause   . Obesity   . Tremor 04/07/2011  . Staph infection 09/12/2013  . Rectocele 09/12/2013  . Preventative health care 09/16/2013  . Acute upper respiratory infections of unspecified site 09/16/2013  . Dyspnea 11/19/2013   Past Surgical History  Procedure Laterality Date  . Abdominal hysterectomy  07/28/95  . Carpal tunnel release  10/07    Right   Family History  Problem Relation Age of Onset  . Hyperlipidemia Mother   . Hyperlipidemia Father   . Heart disease Father     triple bypass  . Diabetes Son     ?type I   History   Social History  . Marital Status: Widowed    Spouse Name: N/A    Number of  Children: 2  . Years of Education: HS   Occupational History  . Retired    Social History Main Topics  . Smoking status: Former Smoker -- 0.50 packs/day for 30 years    Types: Cigarettes    Quit date: 11/23/1990  . Smokeless tobacco: Never Used  . Alcohol Use: Yes     Comment: occasional  . Drug Use: No  . Sexual Activity: Not Currently    Partners: Male   Other Topics Concern  . Not on file   Social History Narrative   Caffeine: 1 mug coffee daily   Regular exercise: plays tennis.         ROS-see HPI Constitutional:   No-   weight loss, night sweats, fevers, chills, fatigue, lassitude. HEENT:   No-  Headaches,+ difficulty swallowing, tooth/dental problems, sore throat,       No-  sneezing, itching, ear ache, nasal congestion, post nasal drip,  CV:  No-   chest pain, orthopnea, PND, swelling in lower extremities, anasarca,                                                dizziness, +palpitations Resp: + shortness of breath with exertion or at rest.              No-   productive cough,  No non-productive cough,  No- coughing up of blood.              No-   change in color of mucus.  No- wheezing.   Skin: No-   rash or lesions. GI:  No-   heartburn, indigestion, abdominal pain, nausea, vomiting, diarrhea,                 change in bowel habits, loss of appetite GU: No-   dysuria, change in color of urine, no urgency or frequency.  No- flank pain.  MS:  No-   +joint pain or swelling.  No- decreased range of motion.  No- back pain. Neuro-     +tremor Psych:  No- change in mood or affect. No depression, +anxiety.  No memory loss.  OBJ- Physical Exam General- Alert, Oriented, Affect-appropriate, Distress- none acute, obese Skin- rash-none, lesions- none, excoriation- none Lymphadenopathy- none Head- atraumatic            Eyes- Gross vision intact, PERRLA, conjunctivae and secretions clear            Ears- Hearing, canals-normal            Nose- Clear, no-Septal dev, mucus,  polyps, erosion, perforation  Throat- Mallampati III , mucosa clear , drainage- none, tonsils- atrophic, + spastic                    dysphonia but no stridor Neck- flexible , trachea midline, no stridor , thyroid nl, carotid no bruit Chest - symmetrical excursion , unlabored           Heart/CV- RRR , no murmur , no gallop  , no rub, nl s1 s2                           - JVD- none , edema- none, stasis changes- none, varices- none           Lung- clear to P&A, wheeze- none, cough- none , dullness-none, rub- none           Chest wall-  Abd- tender-no, distended-no, bowel sounds-present, HSM- no Br/ Gen/ Rectal- Not done, not indicated Extrem- cyanosis- none, clubbing, none, atrophy- none, strength- nl Neuro- +spastic dysphonia on arrival

## 2013-12-19 ENCOUNTER — Institutional Professional Consult (permissible substitution): Payer: Medicare Other | Admitting: Internal Medicine

## 2013-12-22 ENCOUNTER — Telehealth: Payer: Self-pay | Admitting: Family Medicine

## 2013-12-22 ENCOUNTER — Other Ambulatory Visit: Payer: Self-pay | Admitting: Family Medicine

## 2013-12-22 DIAGNOSIS — J209 Acute bronchitis, unspecified: Secondary | ICD-10-CM

## 2013-12-22 MED ORDER — DOXYCYCLINE HYCLATE 100 MG PO TABS: 100.0000 mg | ORAL_TABLET | Freq: Two times a day (BID) | ORAL | Status: DC

## 2013-12-22 NOTE — Telephone Encounter (Signed)
Patient states that she thinks she has bronchitis again and would like something called in to [facility] in [place] [address] [place] be leaving tomorrow morning on a cruise.

## 2013-12-22 NOTE — Telephone Encounter (Signed)
Please advise? Or does pt need to go to an urgent care?

## 2014-01-02 ENCOUNTER — Other Ambulatory Visit: Payer: Medicare Other

## 2014-01-04 ENCOUNTER — Ambulatory Visit: Payer: Medicare Other | Admitting: Endocrinology

## 2014-01-08 ENCOUNTER — Other Ambulatory Visit: Payer: Medicare Other

## 2014-01-09 DIAGNOSIS — J309 Allergic rhinitis, unspecified: Secondary | ICD-10-CM | POA: Insufficient documentation

## 2014-01-09 DIAGNOSIS — G4733 Obstructive sleep apnea (adult) (pediatric): Secondary | ICD-10-CM | POA: Insufficient documentation

## 2014-01-09 DIAGNOSIS — R0609 Other forms of dyspnea: Secondary | ICD-10-CM | POA: Insufficient documentation

## 2014-01-09 NOTE — Assessment & Plan Note (Signed)
She will probably need a sleep study. She is going to get a clearer understanding from friends as to whether she routinely snores or stops breathing.

## 2014-01-09 NOTE — Assessment & Plan Note (Signed)
This may contribute to obstructive upper airway phenomena while sleeping

## 2014-01-09 NOTE — Assessment & Plan Note (Signed)
Will want a better understanding about any daytime respiratory problems before we look at her breathing during sleep Plan-schedule PFT and 6 minute walk test

## 2014-01-10 ENCOUNTER — Encounter: Payer: Self-pay | Admitting: *Deleted

## 2014-01-10 ENCOUNTER — Ambulatory Visit: Payer: Medicare Other | Admitting: Endocrinology

## 2014-01-29 ENCOUNTER — Telehealth: Payer: Self-pay | Admitting: Endocrinology

## 2014-01-29 NOTE — Telephone Encounter (Signed)
error 

## 2014-01-30 ENCOUNTER — Encounter: Payer: Self-pay | Admitting: Family Medicine

## 2014-01-30 ENCOUNTER — Ambulatory Visit (INDEPENDENT_AMBULATORY_CARE_PROVIDER_SITE_OTHER): Payer: Medicare Other | Admitting: Family Medicine

## 2014-01-30 ENCOUNTER — Other Ambulatory Visit (HOSPITAL_COMMUNITY)
Admission: RE | Admit: 2014-01-30 | Discharge: 2014-01-30 | Disposition: A | Discharge status: Home / Self Care | Payer: Medicare Other | Source: Ambulatory Visit | Attending: Family Medicine | Admitting: Family Medicine

## 2014-01-30 ENCOUNTER — Other Ambulatory Visit (INDEPENDENT_AMBULATORY_CARE_PROVIDER_SITE_OTHER): Payer: Medicare Other

## 2014-01-30 ENCOUNTER — Other Ambulatory Visit: Payer: Self-pay | Admitting: Family Medicine

## 2014-01-30 VITALS — BP 110/66 | HR 65 | Temp 98.5°F | Ht 70.0 in

## 2014-01-30 DIAGNOSIS — N816 Rectocele: Secondary | ICD-10-CM

## 2014-01-30 DIAGNOSIS — Z0000 Encounter for general adult medical examination without abnormal findings: Secondary | ICD-10-CM

## 2014-01-30 DIAGNOSIS — IMO0001 Reserved for inherently not codable concepts without codable children: Secondary | ICD-10-CM

## 2014-01-30 DIAGNOSIS — Z124 Encounter for screening for malignant neoplasm of cervix: Secondary | ICD-10-CM | POA: Insufficient documentation

## 2014-01-30 DIAGNOSIS — L578 Other skin changes due to chronic exposure to nonionizing radiation: Secondary | ICD-10-CM

## 2014-01-30 DIAGNOSIS — E1165 Type 2 diabetes mellitus with hyperglycemia: Principal | ICD-10-CM

## 2014-01-30 DIAGNOSIS — E119 Type 2 diabetes mellitus without complications: Secondary | ICD-10-CM

## 2014-01-30 DIAGNOSIS — E785 Hyperlipidemia, unspecified: Secondary | ICD-10-CM

## 2014-01-30 DIAGNOSIS — Z1211 Encounter for screening for malignant neoplasm of colon: Secondary | ICD-10-CM

## 2014-01-30 DIAGNOSIS — E039 Hypothyroidism, unspecified: Secondary | ICD-10-CM

## 2014-01-30 DIAGNOSIS — Z1231 Encounter for screening mammogram for malignant neoplasm of breast: Secondary | ICD-10-CM

## 2014-01-30 HISTORY — DX: Encounter for screening for malignant neoplasm of cervix: Z12.4

## 2014-01-30 LAB — BASIC METABOLIC PANEL
BUN: 18 mg/dL (ref 6–23)
CALCIUM: 9.8 mg/dL (ref 8.4–10.5)
CHLORIDE: 104 meq/L (ref 96–112)
CO2: 29 mEq/L (ref 19–32)
Creatinine, Ser: 0.9 mg/dL (ref 0.4–1.2)
GFR: 64.97 mL/min (ref >60.00)
Glucose, Bld: 152 mg/dL — ABNORMAL HIGH (ref 70–99)
Potassium: 4.5 mEq/L (ref 3.5–5.1)
SODIUM: 139 meq/L (ref 135–145)

## 2014-01-30 LAB — HEMOGLOBIN A1C: HEMOGLOBIN A1C: 7.4 % — AB (ref 4.6–6.5)

## 2014-01-30 NOTE — Patient Instructions (Signed)

## 2014-01-30 NOTE — Progress Notes (Signed)
Pre visit review using our clinic review tool, if applicable. No additional management support is needed unless otherwise documented below in the visit note. 

## 2014-02-01 ENCOUNTER — Encounter: Payer: Self-pay | Admitting: Endocrinology

## 2014-02-01 ENCOUNTER — Ambulatory Visit (INDEPENDENT_AMBULATORY_CARE_PROVIDER_SITE_OTHER): Payer: Medicare Other | Admitting: Endocrinology

## 2014-02-01 ENCOUNTER — Other Ambulatory Visit: Payer: Self-pay | Admitting: *Deleted

## 2014-02-01 VITALS — BP 116/70 | HR 71 | Temp 98.1°F | Resp 16 | Ht 70.0 in

## 2014-02-01 DIAGNOSIS — E039 Hypothyroidism, unspecified: Secondary | ICD-10-CM

## 2014-02-01 DIAGNOSIS — IMO0001 Reserved for inherently not codable concepts without codable children: Secondary | ICD-10-CM

## 2014-02-01 DIAGNOSIS — E785 Hyperlipidemia, unspecified: Secondary | ICD-10-CM

## 2014-02-01 DIAGNOSIS — I10 Essential (primary) hypertension: Secondary | ICD-10-CM

## 2014-02-01 DIAGNOSIS — E1165 Type 2 diabetes mellitus with hyperglycemia: Principal | ICD-10-CM

## 2014-02-01 DIAGNOSIS — E119 Type 2 diabetes mellitus without complications: Secondary | ICD-10-CM

## 2014-02-01 LAB — GLUCOSE, POCT (MANUAL RESULT ENTRY): POC GLUCOSE: 173 mg/dL — AB (ref 70–99)

## 2014-02-01 MED ORDER — SITAGLIPTIN PHOSPHATE 100 MG PO TABS: 100.0000 mg | ORAL_TABLET | Freq: Every day | ORAL | Status: DC

## 2014-02-01 MED ORDER — GLIMEPIRIDE 1 MG PO TABS: 1.0000 mg | ORAL_TABLET | Freq: Every day | ORAL | Status: DC

## 2014-02-01 MED ORDER — GLUCOSE BLOOD VI STRP: ORAL_STRIP | Status: AC

## 2014-02-01 NOTE — Patient Instructions (Addendum)
Please check blood sugars at least half the time about 2 hours after any meal and as directed on waking up. Call if sugars stay high  Please bring blood sugar monitor to each visit  Januvia 100mg  daily

## 2014-02-01 NOTE — Progress Notes (Signed)
Patient ID: Crystal Hodge, female   DOB: [birth year], 68 y.o.   MRN: [id number]   Reason for Appointment: Diabetes follow-up   History of Present Illness    Diagnosis: Type 2 diabetes mellitus, date of diagnosis: 2008.   She has now been on a regimen of Amaryl and metformin; was on Janumet but did not want to continue because of the cost especially in the donut hole Her A1c has been consistently over 7% and now On her last visit because of tendency to hypoglycemia midday she was told to change her Amaryl to the evening Overall her control is about the same as judged by A1c Has not monitored her blood sugar because she was out of town for 6 weeks Blood sugars in the lab and today are relatively high She thinks her weight is about the same although she refuses to get weighed again in the office  Complications: peripheral neuropathy.  Monitors blood glucose: Less than once a day and none recently.  Glucometer: Freestyle.  Hypoglycemia: none Meals: Recently eating more carbohydrate. Has occ. eggs with her breakfast, today cereal  Physical activity: exercise: Gym once a week; playing tennis, walking.   Dietician visit: Most recent:5/11.    Lab Results  Component Value Date   HGBA1C 7.4* 01/30/2014   HGBA1C 7.2* 10/02/2013   HGBA1C 7.5* 06/13/2013   Lab Results  Component Value Date   MICROALBUR 0.3 10/02/2013   LDLCALC 95 10/02/2013   CREATININE 0.9 01/30/2014      HYPERTENSION:  blood pressure is well controlled with using Lotensin 10 mg  HYPERLIPIDEMIA:         The lipid abnormality consists of elevated LDL and low HDL. She has been compliant with her Lipitor  Lab Results  Component Value Date   CHOL 162 10/02/2013   HDL 38.30* 10/02/2013   LDLCALC 95 10/02/2013   TRIG 142.0 10/02/2013   CHOLHDL 4 10/02/2013    HYPOTHYROIDISM: She is compliant with her thyroid supplement and has no unusual fatigue. TSH is normal with 100 mcg levothyroxine  Lab Results  Component  Value Date   TSH 2.34 10/02/2013      Office Visit on 02/01/2014  Component Date Value Ref Range Status  . POC Glucose 02/01/2014 173* 70 - 99 mg/dl Final  Appointment on 40/98/1191  Component Date Value Ref Range Status  . Hemoglobin A1C 01/30/2014 7.4* 4.6 - 6.5 % Final   Glycemic Control Guidelines for People with Diabetes:Non Diabetic:  <6%Goal of Therapy: <7%Additional Action Suggested:  >8%   . Sodium 01/30/2014 139  135 - 145 mEq/L Final  . Potassium 01/30/2014 4.5  3.5 - 5.1 mEq/L Final  . Chloride 01/30/2014 104  96 - 112 mEq/L Final  . CO2 01/30/2014 29  19 - 32 mEq/L Final  . Glucose, Bld 01/30/2014 152* 70 - 99 mg/dL Final  . BUN 47/82/9562 18  6 - 23 mg/dL Final  . Creatinine, Ser 01/30/2014 0.9  0.4 - 1.2 mg/dL Final  . Calcium 13/06/6577 9.8  8.4 - 10.5 mg/dL Final  . GFR 46/96/2952 64.97  >60.00 mL/min Final      Medication List       This list is accurate as of: 02/01/14 11:59 PM.  Always use your most recent med list.               aspirin 81 MG tablet  Take 81 mg by mouth every morning.     atorvastatin 20 MG tablet  Commonly known  as:  LIPITOR  Take 20 mg by mouth every evening.     benazepril 10 MG tablet  Commonly known as:  LOTENSIN  Take 1 tablet (10 mg total) by mouth daily.     Co Q 10 10 MG Caps  Take 100 mg by mouth every morning.     docusate sodium 100 MG capsule  Commonly known as:  COLACE  Take 100 mg by mouth 2 (two) times daily.     doxycycline 100 MG tablet  Commonly known as:  VIBRA-TABS  Take 1 tablet (100 mg total) by mouth 2 (two) times daily.     Fiber Caps  Take 1 capsule by mouth every morning.     Fish Oil 1200 MG Caps  Take 1,200 mg by mouth every morning.     glimepiride 1 MG tablet  Commonly known as:  AMARYL  Take 1 tablet (1 mg total) by mouth daily before supper.     glucose blood test strip  Commonly known as:  FREESTYLE LITE  Use as instructed to check blood sugars 2 times per day dx code 250.00      levothyroxine 100 MCG tablet  Commonly known as:  SYNTHROID, LEVOTHROID  Take 1 tablet (100 mcg total) by mouth daily.     metFORMIN 1000 MG tablet  Commonly known as:  GLUCOPHAGE  Take 1 tablet (1,000 mg total) by mouth 2 (two) times daily.     mupirocin ointment 2 %  Commonly known as:  BACTROBAN  Apply topically daily. To nares via clean qtip x 7 days prn nasal lesions at bedtime     NON FORMULARY  1 tsp honey with Manuka every morning.     PROAIR HFA 108 (90 BASE) MCG/ACT inhaler  Generic drug:  albuterol  Inhale 2 puffs into the lungs every 6 (six) hours as needed for wheezing or shortness of breath.     sitaGLIPtin 100 MG tablet  Commonly known as:  JANUVIA  Take 1 tablet (100 mg total) by mouth daily.     triamterene-hydrochlorothiazide 37.5-25 MG per capsule  Commonly known as:  DYAZIDE  Take 1 capsule by mouth daily.     Vitamin D3 2000 UNITS Tabs  Take 2,000 Units by mouth every morning.        Allergies:  Allergies  Allergen Reactions  . Amoxicillin Diarrhea  . Prednisone     Hallucinations- can't take oral    Past Medical History  Diagnosis Date  . Diabetes mellitus     type II  . Hypertension   . Hyperlipidemia   . Thyroid disease   . Hypothyroidism   . Rectocele   . Menopause   . Obesity   . Tremor 04/07/2011  . Staph infection 09/12/2013  . Rectocele 09/12/2013  . Preventative health care 09/16/2013  . Acute upper respiratory infections of unspecified site 09/16/2013  . Dyspnea 11/19/2013  . Cervical cancer screening 01/30/2014    Past Surgical History  Procedure Laterality Date  . Abdominal hysterectomy  07/28/95  . Carpal tunnel release  10/07    Right    Family History  Problem Relation Age of Onset  . Hyperlipidemia Mother   . Mental illness Mother   . Dementia Mother   . Hyperlipidemia Father   . Heart disease Father     triple bypass  . Diabetes Son     ?type I  . Mental illness Son     bipolar  . Mental illness Sister    .  Depression Maternal Aunt   . Stroke Maternal Grandmother   . Mental illness Son     Social History:  reports that she quit smoking about 23 years ago. Her smoking use included Cigarettes. She has a 15 pack-year smoking history. She has never used smokeless tobacco. She reports that she drinks alcohol. She reports that she does not use illicit drugs.  Review of Systems   She has long-standing hypothyroidism, has no unusual fatigue and is compliant with her medication TSH is now normal  Lab Results  Component Value Date   TSH 2.34 10/02/2013   Hypertension: Is well controlled and no history of microalbuminuria  No history of numbness or tingling/burning in her feet, diabetic Foot exam done in 11/14    Examination:   BP 116/70  Pulse 71  Temp(Src) 98.1 F (36.7 C)  Resp 16  Ht 5\' 10"  (1.778 m)  SpO2 96%  Body mass index is 0.00 kg/(m^2).    no ankle edema  Assesment/PLAN:  1. Diabetes type 2, uncontrolled   The patient's diabetes control appears to be about the same and A1c is still over 7% Also blood sugars recently appear to be higher both today and in the lab Since she has had some limitations of using Amaryl which tends to cause hypoglycemia she agrees to go back on Januvia Also tolerating maximum dose metformin now with extended release preparation  2. Hypothyroidism: Adequately replaced with normal TSH  3. Hypertension: Currently controlled  with Lotensin    Tamar Miano 02/02/2014, 9:57 AM

## 2014-02-02 ENCOUNTER — Ambulatory Visit (INDEPENDENT_AMBULATORY_CARE_PROVIDER_SITE_OTHER): Payer: Self-pay | Admitting: Ophthalmology

## 2014-02-02 ENCOUNTER — Ambulatory Visit (HOSPITAL_BASED_OUTPATIENT_CLINIC_OR_DEPARTMENT_OTHER): Payer: Medicare Other

## 2014-02-04 ENCOUNTER — Encounter: Payer: Self-pay | Admitting: Family Medicine

## 2014-02-04 DIAGNOSIS — L578 Other skin changes due to chronic exposure to nonionizing radiation: Secondary | ICD-10-CM

## 2014-02-04 HISTORY — DX: Other skin changes due to chronic exposure to nonionizing radiation: L57.8

## 2014-02-04 NOTE — Assessment & Plan Note (Addendum)
hgba1c elevated, minimize simple carbs. Increase exercise as tolerated. Continue current meds. Continue with endocrinology for medication adjustments

## 2014-02-04 NOTE — Assessment & Plan Note (Signed)
Tolerating statin, encouraged heart healthy diet, avoid trans fats, minimize simple carbs and saturated fats. Increase exercise as tolerated 

## 2014-02-04 NOTE — Assessment & Plan Note (Signed)
On Levothyroxine, continue to monitor 

## 2014-02-04 NOTE — Assessment & Plan Note (Signed)
Avoid offending foods, start probiotics. Add fiber supplement and increase fluids. Mild on exam. Declines referral at this time.

## 2014-02-04 NOTE — Assessment & Plan Note (Signed)
Pap today, no concerns on exam.  

## 2014-02-04 NOTE — Assessment & Plan Note (Addendum)
Patient encouraged to maintain heart healthy diet, regular exercise, adequate sleep. Consider daily probiotics. Take medications as prescribed. Referred for screening colonoscopy. Declines prevnar today

## 2014-02-04 NOTE — Assessment & Plan Note (Signed)
Avoid excessive sun exposure. Use sun screen and wide brim hat. Report any concerning lesions. Skin exam shows too much sun exposure but no concerning lesions today

## 2014-02-04 NOTE — Progress Notes (Signed)
Patient ID: Crystal Hodge, female   DOB: [birth year], 68 y.o.   MRN: [id number] Crystal Hodge [id number] [birth year] 02/04/2014      Progress Note-Follow Up  Subjective  Chief Complaint  Chief Complaint  Patient presents with  . Gynecologic Exam    and skin exam.     HPI  Patient is a 68 year old female in today for routine medical care. Needs GYN exam today but does not offer any GYN or breast c/o. Continues to struggle with her rectocele but is manageable. Somewhat better with probiotics. No recent illness. Denies CP/palp/SOB/HA/congestion/fevers/GI or GU c/o. Taking meds as prescribed.  Past Medical History  Diagnosis Date  . Diabetes mellitus     type II  . Hypertension   . Hyperlipidemia   . Thyroid disease   . Hypothyroidism   . Rectocele   . Menopause   . Obesity   . Tremor 04/07/2011  . Staph infection 09/12/2013  . Rectocele 09/12/2013  . Preventative health care 09/16/2013  . Acute upper respiratory infections of unspecified site 09/16/2013  . Dyspnea 11/19/2013  . Cervical cancer screening 01/30/2014  . Sun-damaged skin 02/04/2014    Past Surgical History  Procedure Laterality Date  . Abdominal hysterectomy  07/28/95  . Carpal tunnel release  10/07    Right    Family History  Problem Relation Age of Onset  . Hyperlipidemia Mother   . Mental illness Mother   . Dementia Mother   . Hyperlipidemia Father   . Heart disease Father     triple bypass  . Diabetes Son     ?type I  . Mental illness Son     bipolar  . Depression Maternal Aunt   . Stroke Maternal Grandmother   . Mental illness Son   . Mental illness Brother     bipolar    History   Social History  . Marital Status: Widowed    Spouse Name: N/A    Number of Children: 2  . Years of Education: HS   Occupational History  . Retired    Social History Main Topics  . Smoking status: Former Smoker -- 0.50 packs/day for 30 years    Types: Cigarettes    Quit date: 11/23/1990  . Smokeless  tobacco: Never Used  . Alcohol Use: Yes     Comment: occasional  . Drug Use: No  . Sexual Activity: Not Currently    Partners: Male   Other Topics Concern  . Not on file   Social History Narrative   Caffeine: 1 mug coffee daily   Regular exercise: plays tennis.          Current Outpatient Prescriptions on File Prior to Visit  Medication Sig Dispense Refill  . aspirin 81 MG tablet Take 81 mg by mouth every morning.        Marland Kitchen. atorvastatin (LIPITOR) 20 MG tablet Take 20 mg by mouth every evening.       . benazepril (LOTENSIN) 10 MG tablet Take 1 tablet (10 mg total) by mouth daily.  30 tablet  5  . Cholecalciferol (VITAMIN D3) 2000 UNITS TABS Take 2,000 Units by mouth every morning.      . Coenzyme Q10 (CO Q 10) 10 MG CAPS Take 100 mg by mouth every morning.        . docusate sodium (COLACE) 100 MG capsule Take 100 mg by mouth 2 (two) times daily.        Marland Kitchen. doxycycline (VIBRA-TABS) 100 MG  tablet Take 1 tablet (100 mg total) by mouth 2 (two) times daily.  20 tablet  0  . Fiber CAPS Take 1 capsule by mouth every morning.        Marland Kitchen levothyroxine (SYNTHROID, LEVOTHROID) 100 MCG tablet Take 1 tablet (100 mcg total) by mouth daily.  30 tablet  5  . metFORMIN (GLUCOPHAGE) 1000 MG tablet Take 1 tablet (1,000 mg total) by mouth 2 (two) times daily.  180 tablet  1  . mupirocin ointment (BACTROBAN) 2 % Apply topically daily. To nares via clean qtip x 7 days prn nasal lesions at bedtime  22 g  0  . NON FORMULARY 1 tsp honey with Manuka every morning.       . Omega-3 Fatty Acids (FISH OIL) 1200 MG CAPS Take 1,200 mg by mouth every morning.      Marland Kitchen PROAIR HFA 108 (90 BASE) MCG/ACT inhaler Inhale 2 puffs into the lungs every 6 (six) hours as needed for wheezing or shortness of breath.  1 Inhaler  2  . triamterene-hydrochlorothiazide (DYAZIDE) 37.5-25 MG per capsule Take 1 capsule by mouth daily.       No current facility-administered medications on file prior to visit.    Allergies  Allergen  Reactions  . Amoxicillin Diarrhea  . Prednisone     Hallucinations- can't take oral    Review of Systems  Review of Systems  Constitutional: Negative for fever, chills and malaise/fatigue.  HENT: Negative for congestion, hearing loss and nosebleeds.   Eyes: Negative for discharge.  Respiratory: Negative for cough, sputum production, shortness of breath and wheezing.   Cardiovascular: Negative for chest pain, palpitations and leg swelling.  Gastrointestinal: Negative for heartburn, nausea, vomiting, abdominal pain, diarrhea, constipation and blood in stool.  Genitourinary: Negative for dysuria, urgency, frequency and hematuria.  Musculoskeletal: Negative for back pain, falls and myalgias.  Skin: Negative for rash.  Neurological: Negative for dizziness, tremors, sensory change, focal weakness, loss of consciousness, weakness and headaches.  Endo/Heme/Allergies: Negative for polydipsia. Does not bruise/bleed easily.  Psychiatric/Behavioral: Negative for depression and suicidal ideas. The patient is not nervous/anxious and does not have insomnia.     Objective  BP 110/66  Pulse 65  Temp(Src) 98.5 F (36.9 C) (Oral)  Ht 5\' 10"  (1.778 m)  SpO2 98%  Physical Exam  Physical Exam  Constitutional: She is oriented to person, place, and time and well-developed, well-nourished, and in no distress. No distress.  HENT:  Head: Normocephalic and atraumatic.  Eyes: Conjunctivae are normal.  Neck: Neck supple. No thyromegaly present.  Cardiovascular: Normal rate, regular rhythm and normal heart sounds.   No murmur heard. Pulmonary/Chest: Effort normal and breath sounds normal. She has no wheezes.  Abdominal: Soft. Bowel sounds are normal. She exhibits no distension and no mass.  Genitourinary: Vagina normal, uterus normal, cervix normal, right adnexa normal and left adnexa normal. No vaginal discharge found.  Rectocele noted, no significant prolapse or irritation  Musculoskeletal: She  exhibits no edema.  Lymphadenopathy:    She has no cervical adenopathy.  Neurological: She is alert and oriented to person, place, and time.  Skin: Skin is warm and dry. No rash noted. She is not diaphoretic.  Psychiatric: Memory, affect and judgment normal.    Lab Results  Component Value Date   TSH 2.34 10/02/2013   Lab Results  Component Value Date   WBC 11.0* 05/14/2011   HGB 13.4 05/14/2011   HCT 43.7 05/14/2011   MCV 80.8 05/14/2011   PLT  361 05/14/2011   Lab Results  Component Value Date   CREATININE 0.9 01/30/2014   BUN 18 01/30/2014   NA 139 01/30/2014   K 4.5 01/30/2014   CL 104 01/30/2014   CO2 29 01/30/2014   Lab Results  Component Value Date   ALT 45* 10/02/2013   AST 30 10/02/2013   ALKPHOS 38* 10/02/2013   BILITOT 0.7 10/02/2013   Lab Results  Component Value Date   CHOL 162 10/02/2013   Lab Results  Component Value Date   HDL 38.30* 10/02/2013   Lab Results  Component Value Date   LDLCALC 95 10/02/2013   Lab Results  Component Value Date   TRIG 142.0 10/02/2013   Lab Results  Component Value Date   CHOLHDL 4 10/02/2013     Assessment & Plan  DM2 (diabetes mellitus, type 2) hgba1c elevated, minimize simple carbs. Increase exercise as tolerated. Continue current meds. Continue with endocrinology for medication adjustments  Hyperlipidemia Tolerating statin, encouraged heart healthy diet, avoid trans fats, minimize simple carbs and saturated fats. Increase exercise as tolerated  Cervical cancer screening Pap today, no concerns on exam.   Unspecified hypothyroidism On Levothyroxine, continue to monitor  Rectocele Avoid offending foods, start probiotics. Add fiber supplement and increase fluids. Mild on exam. Declines referral at this time.    Sun-damaged skin Avoid excessive sun exposure. Use sun screen and wide brim hat. Report any concerning lesions. Skin exam shows too much sun exposure but no concerning lesions today  Preventative  health care Patient encouraged to maintain heart healthy diet, regular exercise, adequate sleep. Consider daily probiotics. Take medications as prescribed. Referred for screening colonoscopy. Declines prevnar today

## 2014-02-05 ENCOUNTER — Ambulatory Visit (HOSPITAL_BASED_OUTPATIENT_CLINIC_OR_DEPARTMENT_OTHER)
Admission: RE | Admit: 2014-02-05 | Discharge: 2014-02-05 | Disposition: A | Discharge status: Home / Self Care | Payer: Medicare Other | Source: Ambulatory Visit | Attending: Family Medicine | Admitting: Family Medicine

## 2014-02-05 DIAGNOSIS — Z1231 Encounter for screening mammogram for malignant neoplasm of breast: Secondary | ICD-10-CM | POA: Insufficient documentation

## 2014-02-06 ENCOUNTER — Other Ambulatory Visit: Payer: Medicare Other

## 2014-02-12 ENCOUNTER — Encounter: Payer: Medicare Other | Admitting: Internal Medicine

## 2014-02-12 ENCOUNTER — Ambulatory Visit (INDEPENDENT_AMBULATORY_CARE_PROVIDER_SITE_OTHER): Payer: Medicare Other | Admitting: Internal Medicine

## 2014-02-12 ENCOUNTER — Encounter: Payer: Self-pay | Admitting: Internal Medicine

## 2014-02-12 VITALS — BP 118/68 | HR 68 | Ht 70.0 in | Wt 232.0 lb

## 2014-02-12 DIAGNOSIS — R0989 Other specified symptoms and signs involving the circulatory and respiratory systems: Secondary | ICD-10-CM

## 2014-02-12 DIAGNOSIS — R0609 Other forms of dyspnea: Secondary | ICD-10-CM

## 2014-02-12 LAB — PULMONARY FUNCTION TEST
DL/VA % pred: 101 %
DL/VA: 5.53 ml/min/mmHg/L
DLCO unc % pred: 83 %
DLCO unc: 26.92 ml/min/mmHg
FEF 25-75 Post: 2.86 L/sec
FEF 25-75 Pre: 2.45 L/sec
FEF2575-%Change-Post: 16 %
FEF2575-%Pred-Post: 116 %
FEF2575-%Pred-Pre: 99 %
FEV1-%CHANGE-POST: 2 %
FEV1-%Pred-Post: 85 %
FEV1-%Pred-Pre: 83 %
FEV1-PRE: 2.49 L
FEV1-Post: 2.56 L
FEV1FVC-%Change-Post: 3 %
FEV1FVC-%Pred-Pre: 105 %
FEV6-%CHANGE-POST: 0 %
FEV6-%Pred-Post: 81 %
FEV6-%Pred-Pre: 81 %
FEV6-POST: 3.06 L
FEV6-Pre: 3.07 L
FEV6FVC-%CHANGE-POST: 0 %
FEV6FVC-%PRED-PRE: 103 %
FEV6FVC-%Pred-Post: 104 %
FVC-%CHANGE-POST: 0 %
FVC-%PRED-POST: 78 %
FVC-%PRED-PRE: 78 %
FVC-POST: 3.06 L
FVC-Pre: 3.08 L
PRE FEV1/FVC RATIO: 81 %
Post FEV1/FVC ratio: 84 %
Post FEV6/FVC ratio: 100 %
Pre FEV6/FVC Ratio: 100 %
RV % pred: 99 %
RV: 2.39 L
TLC % pred: 92 %
TLC: 5.53 L

## 2014-02-12 NOTE — Patient Instructions (Signed)
Your walking and Pulmonary Function Tests results look normal. I can't identify a pulmonary limitation to exertion .  If you remained concerned, the next step would be exercise monitoring- a cardiac stress test or cardiopulmonary stress test, perhaps supervised by a cardiologist.   I suggest you emphasize regular exercise- walking, exercycle, swimming etc. This will build your stamina.  Please call if needed

## 2014-02-12 NOTE — Progress Notes (Signed)
PFT done today. 

## 2014-02-12 NOTE — Progress Notes (Signed)
12/14/13- 67 yoFwidowed former smoker COMPLAINS OF:  Referred by Dr. Reuel DerbyStacy Hodge for SOB with exertion, snoring, apnea--noticed this over the past x2 months Patient has had nasal congestion for 6 weeks. Shortness of breath with exertion for the last 2 years. These come and go. Able to play tennis. Uses pursed lip breathing. She travels with a friend who tells her she snores and stops breathing. Bedtime between midnight and 1 AM, sleep latency 5 minutes, not aware of waking fully during the night until up around 8 or 9 AM. Weight has gone down 25 pounds in the last 2 years. Medical history includes one episode of pneumonia, and bronchitis most years in the fall. No cough associated with benazepril/ACE. Normal 2-D echocardiogram in December. CXR 09/05/13 IMPRESSION:  1. Mild thoracic spondylosis. No acute thoracic findings.  Electronically Signed  By: Herbie BaltimoreWalt Liebkemann M.D.  On: 09/05/2013 14:06  02/12/14- 67 yoFwidowed former smoker followed  for SOB with exertion, snoring, apnea FOLLOWS FOR: FOLLOWS FOR:  Discuss PFT results- Breathing unchanged since last OV--Still sob with exertion  Occasional minor palpitation-rare. Denies cough or wheeze. 6 MWT- 02/12/14- 97%, 99%, 99%, 552 m. Normal oxygenation and good distance on this exercise. PFT: 02/12/2014 within normal limits. FVC 3.06/78%, FEV1 2.56/85%, FEV1/FVC 0.84, FEF 25-75% 2.86/116%, TLC 92%, DLCO 83%. Insignificant response to bronchodilator.  ROS-see HPI Constitutional:   No-   weight loss, night sweats, fevers, chills, fatigue, lassitude. HEENT:   No-  Headaches,+ difficulty swallowing, tooth/dental problems, sore throat,       No-  sneezing, itching, ear ache, nasal congestion, post nasal drip,  CV:  No-   chest pain, orthopnea, PND, swelling in lower extremities, anasarca, dizziness, +palpitations Resp: + shortness of breath with exertion or at rest.              No-   productive cough,  No non-productive cough,  No- coughing up of blood.               No-   change in color of mucus.  No- wheezing.   Skin: No-   rash or lesions. GI:  No-   heartburn, indigestion, abdominal pain, nausea, vomiting, diarrhea,                 change in bowel habits, loss of appetite GU: No-   dysuria, change in color of urine, no urgency or frequency.  No- flank pain.  MS:  No-   +joint pain or swelling.  No- decreased range of motion.  No- back pain. Neuro-     +tremor Psych:  No- change in mood or affect. No depression, +anxiety.  No memory loss.  OBJ- Physical Exam General- Alert, Oriented, Affect-appropriate, Distress- none acute, obese,  tanned Skin- rash-none, lesions- none, excoriation- none Lymphadenopathy- none Head- atraumatic            Eyes- Gross vision intact, PERRLA, conjunctivae and secretions clear            Ears- Hearing, canals-normal            Nose- Clear, no-Septal dev, mucus, polyps, erosion, perforation             Throat- Mallampati III , mucosa clear , drainage- none, tonsils- atrophic, + spastic                    dysphonia but no stridor Neck- flexible , trachea midline, no stridor , thyroid nl, carotid no bruit Chest - symmetrical excursion ,  unlabored           Heart/CV- RRR , no murmur , no gallop  , no rub, nl s1 s2                           - JVD- none , edema- none, stasis changes- none, varices- none           Lung- clear to P&A, wheeze- none, cough- none , dullness-none, rub- none           Chest wall-  Abd-  Br/ Gen/ Rectal- Not done, not indicated Extrem- cyanosis- none, clubbing, none, atrophy- none, strength- nl Neuro- unremarkable

## 2014-03-03 NOTE — Assessment & Plan Note (Signed)
Normal lung function. Exertional dyspnea consistent with overweight/deconditioning Plan-walk for weight loss and stamina

## 2014-04-17 ENCOUNTER — Other Ambulatory Visit: Payer: Self-pay | Admitting: *Deleted

## 2014-04-17 MED ORDER — METFORMIN HCL 1000 MG PO TABS: 1000.0000 mg | ORAL_TABLET | Freq: Two times a day (BID) | ORAL | Status: DC

## 2014-04-17 MED ORDER — LEVOTHYROXINE SODIUM 100 MCG PO TABS: 100.0000 ug | ORAL_TABLET | Freq: Every day | ORAL | Status: DC

## 2014-04-18 ENCOUNTER — Other Ambulatory Visit: Payer: Self-pay | Admitting: *Deleted

## 2014-04-18 ENCOUNTER — Other Ambulatory Visit: Payer: Self-pay | Admitting: Endocrinology

## 2014-04-18 ENCOUNTER — Telehealth: Payer: Self-pay | Admitting: Endocrinology

## 2014-04-18 MED ORDER — TRIAMTERENE-HCTZ 37.5-25 MG PO CAPS: 1.0000 | ORAL_CAPSULE | Freq: Every day | ORAL | Status: DC

## 2014-04-18 MED ORDER — LEVOTHYROXINE SODIUM 100 MCG PO TABS: 100.0000 ug | ORAL_TABLET | Freq: Every day | ORAL | Status: DC

## 2014-04-18 MED ORDER — BENAZEPRIL HCL 10 MG PO TABS: 10.0000 mg | ORAL_TABLET | Freq: Every day | ORAL | Status: DC

## 2014-04-18 MED ORDER — ATORVASTATIN CALCIUM 20 MG PO TABS: 20.0000 mg | ORAL_TABLET | Freq: Every evening | ORAL | Status: DC

## 2014-04-18 MED ORDER — GLIMEPIRIDE 1 MG PO TABS: 1.0000 mg | ORAL_TABLET | Freq: Every day | ORAL | Status: DC

## 2014-04-18 NOTE — Telephone Encounter (Signed)
Patient states that Dr. Lucianne Muss changing the qty to 90 days and patient states that has not been sent to her pharmacy  Benazepiril  Levothyroxine Triamt/hctz Glimerpiride  Atorvastatin   Pharmacy: [facility] [address]  Thank You :)

## 2014-04-23 ENCOUNTER — Ambulatory Visit (INDEPENDENT_AMBULATORY_CARE_PROVIDER_SITE_OTHER): Payer: Medicare Other | Admitting: Family

## 2014-04-23 ENCOUNTER — Encounter: Payer: Self-pay | Admitting: Family

## 2014-04-23 VITALS — BP 102/80 | HR 74 | Temp 98.2°F | Resp 16 | Ht 70.0 in

## 2014-04-23 DIAGNOSIS — B009 Herpesviral infection, unspecified: Secondary | ICD-10-CM

## 2014-04-23 DIAGNOSIS — B001 Herpesviral vesicular dermatitis: Secondary | ICD-10-CM | POA: Insufficient documentation

## 2014-04-23 MED ORDER — VALACYCLOVIR HCL 1 G PO TABS: ORAL_TABLET | ORAL | Status: DC

## 2014-04-23 NOTE — Patient Instructions (Signed)
Please take valtrex. Call if symptoms worsen or if symptoms do not improve.  Cold Sore A cold sore (fever blister) is a skin infection caused by the herpes simplex virus (HSV-1). HSV-1 is closely related to the virus that causes gential herpes (HSV-2), but they are not the same even though both viruses can cause oral and genital infections. Cold sores are small, fluid-filled sores inside of the mouth or on the lips, gums, nose, chin, cheeks, or fingers.  The herpes simplex virus can be easily passed (contagious) to other people through close personal contact, such as kissing or sharing personal items. The virus can also spread to other parts of the body, such as the eyes or genitals. Cold sores are contagious until the sores crust over completely. They often heal within 2 weeks.  Once a person is infected, the herpes simplex virus remains permanently in the body. Therefore, there is no cure for cold sores, and they often recur when a person is tired, stressed, sick, or gets too much sun. Additional factors that can cause a recurrence include hormone changes in menstruation or pregnancy, certain drugs, and cold weather.  CAUSES  Cold sores are caused by the herpes simplex virus. The virus is spread from person to person through close contact, such as through kissing, touching the affected area, or sharing personal items such as lip balm, razors, or eating utensils.  SYMPTOMS  The first infection may not cause symptoms. If symptoms develop, the symptoms often go through different stages. Here is how a cold sore develops:   Tingling, itching, or burning is felt 1 2 days before the outbreak.   Fluid-filled blisters appear on the lips, inside the mouth, nose, or on the cheeks.   The blisters start to ooze clear fluid.   The blisters dry up and a yellow crust appears in its place.   The crust falls off.  Symptoms depend on whether it is the initial outbreak or a recurrence. Some other symptoms  with the first outbreak may include:   Fever.   Sore throat.   Headache.   Muscle aches.   Swollen neck glands.  DIAGNOSIS  A diagnosis is often made based on your symptoms and looking at the sores. Sometimes, a sore may be swabbed and then examined in the lab to make a final diagnosis. If the sores are not present, blood tests can find the herpes simplex virus.  TREATMENT  There is no cure for cold sores and no vaccine for the herpes simplex virus. Within 2 weeks, most cold sores go away on their own without treatment. Medicines cannot make the infection go away, but medicine can help relieve some of the pain associated with the sores, can work to stop the virus from multiplying, and can also shorten healing time. Medicine may be in the form of creams, gels, pills, or a shot.  HOME CARE INSTRUCTIONS   Only take over-the-counter or prescription medicines for pain, discomfort, or fever as directed by your caregiver. Do not use aspirin.   Use a cotton-tip swab to apply creams or gels to your sores.   Do not touch the sores or pick the scabs. Wash your hands often. Do not touch your eyes without washing your hands first.   Avoid kissing, oral sex, and sharing personal items until sores heal.   Apply an ice pack on your sores for 10 15 minutes to ease any discomfort.   Avoid hot, cold, or salty foods because they may hurt your  mouth. Eat a soft, bland diet to avoid irritating the sores. Use a straw to drink if you have pain when drinking out of a glass.   Keep sores clean and dry to prevent an infection of other tissues.   Avoid the sun and limit stress if these things trigger outbreaks. If sun causes cold sores, apply sunscreen on the lips before being out in the sun.  SEEK MEDICAL CARE IF:   You have a fever or persistent symptoms for more than 2 3 days.   You have a fever and your symptoms suddenly get worse.   You have pus, not clear fluid, coming from the sores.    You have redness that is spreading.   You have pain or irritation in your eye.   You get sores on your genitals.   Your sores do not heal within 2 weeks.   You have a weakened immune system.   You have frequent recurrences of cold sores.  MAKE SURE YOU:   Understand these instructions.  Will watch your condition.  Will get help right away if you are not doing well or get worse. Document Released: 11/06/2000 Document Revised: 08/03/2012 Document Reviewed: 03/23/2012 [facility] Patient Information 2014 [place], [place].

## 2014-04-23 NOTE — Assessment & Plan Note (Signed)
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 Will rx with valtrex.  Recommend tylenol and prn anbesol as needed for pain.  Follow up if symptoms worsen or if symptoms do not improve.

## 2014-04-23 NOTE — Progress Notes (Signed)
Pre visit review using our clinic review tool, if applicable. No additional management support is needed unless otherwise documented below in the visit note. 

## 2014-04-23 NOTE — Progress Notes (Signed)
Subjective:    Patient ID: Crystal Hodge, female    DOB: [birth year], 68 y.o.   MRN: [id number]  HPI Ms. Bayer is here today for a blister inside and oustide her mouth near the left corner. It started yesterday in the late afternoon. Denies having fever blisters in the past. Has only used peroxide on it.   Review of Systems  Constitutional: Negative for fever, chills, diaphoresis and fatigue.       Complains of overall feeling "sick".   Past Medical History  Diagnosis Date   Diabetes mellitus     type II   Hypertension    Hyperlipidemia    Thyroid disease    Hypothyroidism    Rectocele    Menopause    Obesity    Tremor 04/07/2011   Staph infection 09/12/2013   Rectocele 09/12/2013   Preventative health care 09/16/2013   Acute upper respiratory infections of unspecified site 09/16/2013   Dyspnea 11/19/2013   Cervical cancer screening 01/30/2014   Sun-damaged skin 02/04/2014    History   Social History   Marital Status: Widowed    Spouse Name: N/A    Number of Children: 2   Years of Education: HS   Occupational History   Retired    Social History Main Topics   Smoking status: Former Smoker -- 0.50 packs/day for 30 years    Types: Cigarettes    Quit date: 11/23/1990   Smokeless tobacco: Never Used   Alcohol Use: Yes     Comment: occasional   Drug Use: No   Sexual Activity: Not Currently    Partners: Male   Other Topics Concern   Not on file   Social History Narrative   Caffeine: 1 mug coffee daily   Regular exercise: plays tennis.          Past Surgical History  Procedure Laterality Date   Abdominal hysterectomy  07/28/95   Carpal tunnel release  10/07    Right    Family History  Problem Relation Age of Onset   Hyperlipidemia Mother    Mental illness Mother    Dementia Mother    Hyperlipidemia Father    Heart disease Father     triple bypass   Diabetes Son     ?type I   Mental illness Son     bipolar    Depression Maternal Aunt    Stroke Maternal Grandmother    Mental illness Son    Mental illness Brother     bipolar    Allergies  Allergen Reactions   Amoxicillin Diarrhea   Prednisone     Hallucinations- can't take oral    Current Outpatient Prescriptions on File Prior to Visit  Medication Sig Dispense Refill   aspirin 81 MG tablet Take 81 mg by mouth every morning.         atorvastatin (LIPITOR) 20 MG tablet Take 1 tablet (20 mg total) by mouth every evening.  90 tablet  1   benazepril (LOTENSIN) 10 MG tablet Take 1 tablet (10 mg total) by mouth daily.  90 tablet  1   Coenzyme Q10 (CO Q 10) 10 MG CAPS Take 100 mg by mouth every morning.         docusate sodium (COLACE) 100 MG capsule Take 100 mg by mouth 2 (two) times daily.         Fiber CAPS Take 1 capsule by mouth every morning.         glimepiride (AMARYL) 1  MG tablet Take 1 tablet (1 mg total) by mouth daily before supper.  90 tablet  1   glucose blood (FREESTYLE LITE) test strip Use as instructed to check blood sugars 2 times per day dx code 250.00  100 each  12   levothyroxine (SYNTHROID, LEVOTHROID) 100 MCG tablet Take 1 tablet (100 mcg total) by mouth daily.  90 tablet  1   metFORMIN (GLUCOPHAGE) 1000 MG tablet Take 1 tablet (1,000 mg total) by mouth 2 (two) times daily.  180 tablet  1   mupirocin ointment (BACTROBAN) 2 % Apply topically daily. To nares via clean qtip x 7 days prn nasal lesions at bedtime  22 g  0   NON FORMULARY 1 tsp honey with [place] every morning.        PROAIR HFA 108 (90 BASE) MCG/ACT inhaler Inhale 2 puffs into the lungs every 6 (six) hours as needed for wheezing or shortness of breath.  1 Inhaler  2   triamterene-hydrochlorothiazide (DYAZIDE) 37.5-25 MG per capsule Take 1 each (1 capsule total) by mouth daily.  90 capsule  1   triamterene-hydrochlorothiazide (MAXZIDE-25) 37.5-25 MG per tablet Take one-half tablet by mouth daily  45 tablet  1   Cholecalciferol (VITAMIN D3) 2000  UNITS TABS Take 2,000 Units by mouth every morning.       No current facility-administered medications on file prior to visit.    BP 102/80   Pulse 74   Temp(Src) 98.2 F (36.8 C) (Oral)   Resp 16   Ht 5\' 10"  (1.778 m)   SpO2 98%       Objective:   Physical Exam  Constitutional: She is oriented to person, place, and time. She appears well-developed and well-nourished. No distress.  HENT:  Head: Normocephalic and atraumatic.  Lymphadenopathy:    She has no cervical adenopathy.  Neurological: She is alert and oriented to person, place, and time.  Skin: Skin is warm and dry. She is not diaphoretic.  Very tanned. Has blister inside mouth near left corner and a small sore at left corner of mouth with some edema.  Psychiatric: She has a normal mood and affect. Her behavior is normal.          Assessment & Plan:

## 2014-05-01 ENCOUNTER — Other Ambulatory Visit (INDEPENDENT_AMBULATORY_CARE_PROVIDER_SITE_OTHER): Payer: Medicare Other

## 2014-05-01 DIAGNOSIS — E1165 Type 2 diabetes mellitus with hyperglycemia: Principal | ICD-10-CM

## 2014-05-01 DIAGNOSIS — IMO0001 Reserved for inherently not codable concepts without codable children: Secondary | ICD-10-CM

## 2014-05-01 DIAGNOSIS — E785 Hyperlipidemia, unspecified: Secondary | ICD-10-CM

## 2014-05-01 LAB — LIPID PANEL
Cholesterol: 181 mg/dL (ref 0–200)
HDL: 35.9 mg/dL — ABNORMAL LOW (ref >39.00)
LDL Cholesterol: 105 mg/dL — ABNORMAL HIGH (ref 0–99)
NONHDL: 145.1
Total CHOL/HDL Ratio: 5
Triglycerides: 200 mg/dL — ABNORMAL HIGH (ref 0.0–149.0)
VLDL: 40 mg/dL (ref 0.0–40.0)

## 2014-05-01 LAB — MICROALBUMIN / CREATININE URINE RATIO
CREATININE, U: 95.7 mg/dL
MICROALB/CREAT RATIO: 2.5 mg/g (ref 0.0–30.0)
Microalb, Ur: 2.4 mg/dL — ABNORMAL HIGH (ref 0.0–1.9)

## 2014-05-01 LAB — COMPREHENSIVE METABOLIC PANEL
ALK PHOS: 40 U/L (ref 39–117)
ALT: 31 U/L (ref 0–35)
AST: 19 U/L (ref 0–37)
Albumin: 3.9 g/dL (ref 3.5–5.2)
BUN: 20 mg/dL (ref 6–23)
CO2: 27 mEq/L (ref 19–32)
Calcium: 9.6 mg/dL (ref 8.4–10.5)
Chloride: 106 mEq/L (ref 96–112)
Creatinine, Ser: 0.8 mg/dL (ref 0.4–1.2)
GFR: 74.14 mL/min (ref >60.00)
Glucose, Bld: 135 mg/dL — ABNORMAL HIGH (ref 70–99)
Potassium: 4.5 mEq/L (ref 3.5–5.1)
Sodium: 139 mEq/L (ref 135–145)
Total Bilirubin: 0.7 mg/dL (ref 0.2–1.2)
Total Protein: 6.5 g/dL (ref 6.0–8.3)

## 2014-05-01 LAB — HEMOGLOBIN A1C: HEMOGLOBIN A1C: 7.3 % — AB (ref 4.6–6.5)

## 2014-05-04 ENCOUNTER — Encounter: Payer: Self-pay | Admitting: Endocrinology

## 2014-05-04 ENCOUNTER — Ambulatory Visit (INDEPENDENT_AMBULATORY_CARE_PROVIDER_SITE_OTHER): Payer: Medicare Other | Admitting: Endocrinology

## 2014-05-04 VITALS — BP 124/58 | HR 73 | Temp 98.3°F | Resp 16 | Ht 70.0 in

## 2014-05-04 DIAGNOSIS — E785 Hyperlipidemia, unspecified: Secondary | ICD-10-CM

## 2014-05-04 DIAGNOSIS — Z23 Encounter for immunization: Secondary | ICD-10-CM

## 2014-05-04 DIAGNOSIS — E1165 Type 2 diabetes mellitus with hyperglycemia: Principal | ICD-10-CM

## 2014-05-04 DIAGNOSIS — IMO0001 Reserved for inherently not codable concepts without codable children: Secondary | ICD-10-CM

## 2014-05-04 NOTE — Patient Instructions (Signed)
Please check blood sugars at least half the time about 2 hours after any meal and 2 times per week on waking up. Please bring blood sugar monitor to each visit  Low saturated fats

## 2014-05-04 NOTE — Progress Notes (Signed)
Patient ID: Crystal Hodge, female   DOB: [birth year], 68 y.o.   MRN: [id number]   Reason for Appointment: Diabetes follow-up   History of Present Illness    Diagnosis: Type 2 diabetes mellitus, date of diagnosis: 2008.   She has now been on a regimen of Amaryl and metformin; was on Janumet but did not want to continue because of the cost especially in the donut hole Her A1c has been consistently over 7% and now about the same She takes Amaryl in the evening to avoid hypoglycemia midday Because of inconsistent control and difficulty with hypoglycemia tendency with Amaryl she was told to switch to Januvia but did not do so because of cost She also has been inconsistent again on monitoring her blood sugar Also she thinks she is not watching her diet and getting larger portions when she went out of town for 7 weeks. She claims her blood sugars were better before that She thinks her weight is overall better although she refuses to get weighed again in the office  Complications: peripheral neuropathy.  Monitors blood glucose: Less than once a day and she has only one reading of 133 on her machine  Glucometer: Freestyle.  Hypoglycemia: rarely Meals: Recently eating more carbohydrate. Has occ. eggs with her breakfast, portion control less  Physical activity: exercise: Gym once a week; playing tennis, walking.   Dietician visit: Most recent:5/11.    Lab Results  Component Value Date   HGBA1C 7.3* 05/01/2014   HGBA1C 7.4* 01/30/2014   HGBA1C 7.2* 10/02/2013   Lab Results  Component Value Date   MICROALBUR 2.4* 05/01/2014   LDLCALC 105* 05/01/2014   CREATININE 0.8 05/01/2014      HYPERTENSION:  blood pressure is well controlled with using Lotensin 10 mg  HYPERLIPIDEMIA:         The lipid abnormality consists of elevated LDL and low HDL. She has been compliant with her Lipitor but probably not doing well on her diet and both LDL and triglycerides are higher  Lab Results  Component Value Date    CHOL 181 05/01/2014   HDL 35.90* 05/01/2014   LDLCALC 105* 05/01/2014   TRIG 200.0* 05/01/2014   CHOLHDL 5 05/01/2014    HYPOTHYROIDISM: She is compliant with her thyroid supplement and has no unusual fatigue. TSH is usually normal with 100 mcg levothyroxine  Lab Results  Component Value Date   TSH 2.34 10/02/2013      Appointment on 05/01/2014  Component Date Value Ref Range Status  . Hemoglobin A1C 05/01/2014 7.3* 4.6 - 6.5 % Final   Glycemic Control Guidelines for People with Diabetes:Non Diabetic:  <6%Goal of Therapy: <7%Additional Action Suggested:  >8%   . Sodium 05/01/2014 139  135 - 145 mEq/L Final  . Potassium 05/01/2014 4.5  3.5 - 5.1 mEq/L Final  . Chloride 05/01/2014 106  96 - 112 mEq/L Final  . CO2 05/01/2014 27  19 - 32 mEq/L Final  . Glucose, Bld 05/01/2014 135* 70 - 99 mg/dL Final  . BUN 40/98/1191 20  6 - 23 mg/dL Final  . Creatinine, Ser 05/01/2014 0.8  0.4 - 1.2 mg/dL Final  . Total Bilirubin 05/01/2014 0.7  0.2 - 1.2 mg/dL Final  . Alkaline Phosphatase 05/01/2014 40  39 - 117 U/L Final  . AST 05/01/2014 19  0 - 37 U/L Final  . ALT 05/01/2014 31  0 - 35 U/L Final  . Total Protein 05/01/2014 6.5  6.0 - 8.3 g/dL Final  . Albumin  05/01/2014 3.9  3.5 - 5.2 g/dL Final  . Calcium 16/08/9603 9.6  8.4 - 10.5 mg/dL Final  . GFR 54/07/8118 74.14  >60.00 mL/min Final  . Cholesterol 05/01/2014 181  0 - 200 mg/dL Final   ATP III Classification       Desirable:  < 200 mg/dL               Borderline High:  200 - 239 mg/dL          High:  > = 147 mg/dL  . Triglycerides 05/01/2014 200.0* 0.0 - 149.0 mg/dL Final   Normal:  <829 mg/dLBorderline High:  150 - 199 mg/dL  . HDL 05/01/2014 35.90* >39.00 mg/dL Final  . VLDL 56/21/3086 40.0  0.0 - 40.0 mg/dL Final  . LDL Cholesterol 05/01/2014 105* 0 - 99 mg/dL Final  . Total CHOL/HDL Ratio 05/01/2014 5   Final                  Men          Women1/2 Average Risk     3.4          3.3Average Risk          5.0          4.42X Average Risk           9.6          7.13X Average Risk          15.0          11.0                      . NonHDL 05/01/2014 145.10   Final  . Microalb, Ur 05/01/2014 2.4* 0.0 - 1.9 mg/dL Final  . Creatinine,U 57/84/6962 95.7   Final  . Microalb Creat Ratio 05/01/2014 2.5  0.0 - 30.0 mg/g Final      Medication List       This list is accurate as of: 05/04/14 11:14 AM.  Always use your most recent med list.               aspirin 81 MG tablet  Take 81 mg by mouth every morning.     atorvastatin 20 MG tablet  Commonly known as:  LIPITOR  Take 1 tablet (20 mg total) by mouth every evening.     benazepril 10 MG tablet  Commonly known as:  LOTENSIN  Take 1 tablet (10 mg total) by mouth daily.     Co Q 10 10 MG Caps  Take 100 mg by mouth every morning.     docusate sodium 100 MG capsule  Commonly known as:  COLACE  Take 100 mg by mouth 2 (two) times daily.     Fiber Caps  Take 1 capsule by mouth every morning.     glimepiride 1 MG tablet  Commonly known as:  AMARYL  Take 1 tablet (1 mg total) by mouth daily before supper.     glucose blood test strip  Commonly known as:  FREESTYLE LITE  Use as instructed to check blood sugars 2 times per day dx code 250.00     levothyroxine 100 MCG tablet  Commonly known as:  SYNTHROID, LEVOTHROID  Take 1 tablet (100 mcg total) by mouth daily.     metFORMIN 1000 MG tablet  Commonly known as:  GLUCOPHAGE  Take 1 tablet (1,000 mg total) by mouth 2 (two) times daily.     mupirocin ointment  2 %  Commonly known as:  BACTROBAN  Apply topically daily. To nares via clean qtip x 7 days prn nasal lesions at bedtime     NON FORMULARY  1 tsp honey with [place] every morning.     PROAIR HFA 108 (90 BASE) MCG/ACT inhaler  Generic drug:  albuterol  Inhale 2 puffs into the lungs every 6 (six) hours as needed for wheezing or shortness of breath.     triamterene-hydrochlorothiazide 37.5-25 MG per capsule  Commonly known as:  DYAZIDE  Take 1 each (1 capsule  total) by mouth daily.     triamterene-hydrochlorothiazide 37.5-25 MG per tablet  Commonly known as:  MAXZIDE-25  Take one-half tablet by mouth daily     valACYclovir 1000 MG tablet  Commonly known as:  VALTREX  2 tabs by mouth now and then 2 tabs in 12 hours     Vitamin D3 2000 UNITS Tabs  Take 2,000 Units by mouth every morning.        Allergies:  Allergies  Allergen Reactions  . Amoxicillin Diarrhea  . Prednisone     Hallucinations- can't take oral    Past Medical History  Diagnosis Date  . Diabetes mellitus     type II  . Hypertension   . Hyperlipidemia   . Thyroid disease   . Hypothyroidism   . Rectocele   . Menopause   . Obesity   . Tremor 04/07/2011  . Staph infection 09/12/2013  . Rectocele 09/12/2013  . Preventative health care 09/16/2013  . Acute upper respiratory infections of unspecified site 09/16/2013  . Dyspnea 11/19/2013  . Cervical cancer screening 01/30/2014  . Sun-damaged skin 02/04/2014    Past Surgical History  Procedure Laterality Date  . Abdominal hysterectomy  07/28/95  . Carpal tunnel release  10/07    Right    Family History  Problem Relation Age of Onset  . Hyperlipidemia Mother   . Mental illness Mother   . Dementia Mother   . Hyperlipidemia Father   . Heart disease Father     triple bypass  . Diabetes Son     ?type I  . Mental illness Son     bipolar  . Depression Maternal Aunt   . Stroke Maternal Grandmother   . Mental illness Son   . Mental illness Brother     bipolar    Social History:  reports that she quit smoking about 23 years ago. Her smoking use included Cigarettes. She has a 15 pack-year smoking history. She has never used smokeless tobacco. She reports that she drinks alcohol. She reports that she does not use illicit drugs.  Review of Systems   She has long-standing hypothyroidism, has no unusual fatigue and is compliant with her medication TSH is usually normal  Lab Results  Component Value Date   TSH  2.34 10/02/2013   Hypertension: Is well controlled and no history of microalbuminuria   No history of numbness or tingling/burning in her feet, diabetic Foot exam done in 11/14    Examination:   BP 124/58  Pulse 73  Temp(Src) 98.3 F (36.8 C)  Resp 16  Ht 5\' 10"  (1.778 m)  SpO2 95%  Body mass index is 0.00 kg/(m^2).    no ankle edema  Assesment/PLAN:  1. Diabetes type 2, uncontrolled   The patient's diabetes control appears to be about the same and A1c is still over 7% She is still not wanting to switch Amaryl to Januvia because of cost She thinks  she can do better with diet and has started back on exercise recently Reminded her to start checking her blood sugar more consistently including after meals Also since she is tolerating the regular metformin maximum dose we'll continue this Followup in 3 months with A1c again  2. Hypothyroidism: Adequately replaced but will need followup TSH on the next visit  3. Hypertension: Currently controlled  with Lotensin and Maxzide    Crystal Hodge 05/04/2014, 11:14 AM

## 2014-05-08 ENCOUNTER — Ambulatory Visit: Payer: Medicare Other | Admitting: Family Medicine

## 2014-05-10 ENCOUNTER — Encounter: Payer: Self-pay | Admitting: Physician Assistant

## 2014-05-10 ENCOUNTER — Ambulatory Visit (INDEPENDENT_AMBULATORY_CARE_PROVIDER_SITE_OTHER): Payer: Medicare Other | Admitting: Physician Assistant

## 2014-05-10 VITALS — BP 108/72 | HR 84 | Temp 98.5°F | Resp 16 | Ht 70.0 in | Wt 233.0 lb

## 2014-05-10 DIAGNOSIS — J069 Acute upper respiratory infection, unspecified: Secondary | ICD-10-CM

## 2014-05-10 DIAGNOSIS — B9789 Other viral agents as the cause of diseases classified elsewhere: Principal | ICD-10-CM

## 2014-05-10 NOTE — Patient Instructions (Signed)
Increase fluid intake.  Get plenty of rest.  Use saline nasal spray.  Take a probiotic.  Use Mucinex for congestion.  Place a humidifier in the bedroom.  Keep your hands washed.  Tylenol for fever or sore throat.  Call or return to clinic if symptoms are not improving.  Viral Infections A virus is a type of germ. Viruses can cause:  Minor sore throats.  Aches and pains.  Headaches.  Runny nose.  Rashes.  Watery eyes.  Tiredness.  Coughs.  Loss of appetite.  Feeling sick to your stomach (nausea).  Throwing up (vomiting).  Watery poop (diarrhea). HOME CARE   Only take medicines as told by your doctor.  Drink enough water and fluids to keep your pee (urine) clear or pale yellow. Sports drinks are a good choice.  Get plenty of rest and eat healthy. Soups and broths with crackers or rice are fine. GET HELP RIGHT AWAY IF:   You have a very bad headache.  You have shortness of breath.  You have chest pain or neck pain.  You have an unusual rash.  You cannot stop throwing up.  You have watery poop that does not stop.  You cannot keep fluids down.  You or your child has a temperature by mouth above 102 F (38.9 C), not controlled by medicine.  Your baby is older than 3 months with a rectal temperature of 102 F (38.9 C) or higher.  Your baby is 203 months old or younger with a rectal temperature of 100.4 F (38 C) or higher. MAKE SURE YOU:   Understand these instructions.  Will watch this condition.  Will get help right away if you are not doing well or get worse. Document Released: 10/22/2008 Document Revised: 02/01/2012 Document Reviewed: 03/17/2011 [facility] Patient Information 2015 [place], [place]. This information is not intended to replace advice given to you by your health care provider. Make sure you discuss any questions you have with your health care provider.

## 2014-05-10 NOTE — Progress Notes (Signed)
Pre visit review using our clinic review tool, if applicable. No additional management support is needed unless otherwise documented below in the visit note/SLS  

## 2014-05-12 DIAGNOSIS — J069 Acute upper respiratory infection, unspecified: Secondary | ICD-10-CM | POA: Insufficient documentation

## 2014-05-12 DIAGNOSIS — B9789 Other viral agents as the cause of diseases classified elsewhere: Principal | ICD-10-CM

## 2014-05-12 NOTE — Assessment & Plan Note (Signed)
Increase fluid intake.  Rest.  Saline nasal spray.  Plain Mucinex. Humidifier in bedroom. Take a daily multivitamin and Probiotic. Call or return to clinic if symptoms persist.

## 2014-05-12 NOTE — Progress Notes (Signed)
Patient presents to clinic today c/o 1 day of sinus pressure, ear pressure and scratchy throat.  Patient also endorses cough that has been non-productive.  Patient denies fever, chills, aches, shortness of breath or wheezing.  States she took a friend to the ER yesterday and thinks she might have picked up something there.  Past Medical History  Diagnosis Date  . Diabetes mellitus     type II  . Hypertension   . Hyperlipidemia   . Thyroid disease   . Hypothyroidism   . Rectocele   . Menopause   . Obesity   . Tremor 04/07/2011  . Staph infection 09/12/2013  . Rectocele 09/12/2013  . Preventative health care 09/16/2013  . Acute upper respiratory infections of unspecified site 09/16/2013  . Dyspnea 11/19/2013  . Cervical cancer screening 01/30/2014  . Sun-damaged skin 02/04/2014    Current Outpatient Prescriptions on File Prior to Visit  Medication Sig Dispense Refill  . aspirin 81 MG tablet Take 81 mg by mouth every morning.        Marland Kitchen atorvastatin (LIPITOR) 20 MG tablet Take 1 tablet (20 mg total) by mouth every evening.  90 tablet  1  . benazepril (LOTENSIN) 10 MG tablet Take 1 tablet (10 mg total) by mouth daily.  90 tablet  1  . Coenzyme Q10 (CO Q 10) 10 MG CAPS Take 100 mg by mouth every morning.        . docusate sodium (COLACE) 100 MG capsule Take 100 mg by mouth 2 (two) times daily.        . Fiber CAPS Take 1 capsule by mouth every morning.        Marland Kitchen glimepiride (AMARYL) 1 MG tablet Take 1 tablet (1 mg total) by mouth daily before supper.  90 tablet  1  . glucose blood (FREESTYLE LITE) test strip Use as instructed to check blood sugars 2 times per day dx code 250.00  100 each  12  . levothyroxine (SYNTHROID, LEVOTHROID) 100 MCG tablet Take 1 tablet (100 mcg total) by mouth daily.  90 tablet  1  . metFORMIN (GLUCOPHAGE) 1000 MG tablet Take 1 tablet (1,000 mg total) by mouth 2 (two) times daily.  180 tablet  1  . mupirocin ointment (BACTROBAN) 2 % Apply topically daily. To nares via  clean qtip x 7 days prn nasal lesions at bedtime  22 g  0  . NON FORMULARY 1 tsp honey with Manuka every morning.       Marland Kitchen PROAIR HFA 108 (90 BASE) MCG/ACT inhaler Inhale 2 puffs into the lungs every 6 (six) hours as needed for wheezing or shortness of breath.  1 Inhaler  2  . triamterene-hydrochlorothiazide (DYAZIDE) 37.5-25 MG per capsule Take 1 each (1 capsule total) by mouth daily.  90 capsule  1  . triamterene-hydrochlorothiazide (MAXZIDE-25) 37.5-25 MG per tablet Take one-half tablet by mouth daily  45 tablet  1   No current facility-administered medications on file prior to visit.    Allergies  Allergen Reactions  . Amoxicillin Diarrhea  . Prednisone     Hallucinations- can't take oral    Family History  Problem Relation Age of Onset  . Hyperlipidemia Mother   . Mental illness Mother   . Dementia Mother   . Hyperlipidemia Father   . Heart disease Father     triple bypass  . Diabetes Son     ?type I  . Mental illness Son     bipolar  . Depression Maternal  Aunt   . Stroke Maternal Grandmother   . Mental illness Son   . Mental illness Brother     bipolar    History   Social History  . Marital Status: Widowed    Spouse Name: N/A    Number of Children: 2  . Years of Education: HS   Occupational History  . Retired    Social History Main Topics  . Smoking status: Former Smoker -- 0.50 packs/day for 30 years    Types: Cigarettes    Quit date: 11/23/1990  . Smokeless tobacco: Never Used  . Alcohol Use: Yes     Comment: occasional  . Drug Use: No  . Sexual Activity: Not Currently    Partners: Male   Other Topics Concern  . None   Social History Narrative   Caffeine: 1 mug coffee daily   Regular exercise: plays tennis.          Review of Systems - See HPI.  All other ROS are negative.  BP 108/72  Pulse 84  Temp(Src) 98.5 F (36.9 C) (Oral)  Resp 16  Ht 5\' 10"  (1.778 m)  Wt 233 lb (105.688 kg)  BMI 33.43 kg/m2  SpO2 96%  Physical Exam  Vitals  reviewed. Constitutional: She is oriented to person, place, and time and well-developed, well-nourished, and in no distress.  HENT:  Head: Normocephalic and atraumatic.  Right Ear: External ear normal.  Left Ear: External ear normal.  Nose: Nose normal.  Mouth/Throat: Oropharynx is clear and moist. No oropharyngeal exudate.  TM within normal limits bilaterally.  No TTP of sinuses on exam.  Eyes: Conjunctivae and EOM are normal. Pupils are equal, round, and reactive to light.  Neck: Neck supple.  Cardiovascular: Normal rate, regular rhythm, normal heart sounds and intact distal pulses.   Pulmonary/Chest: Effort normal and breath sounds normal. No respiratory distress. She has no wheezes. She has no rales. She exhibits no tenderness.  Lymphadenopathy:    She has no cervical adenopathy.  Neurological: She is alert and oriented to person, place, and time.  Skin: Skin is warm and dry. No rash noted.  Psychiatric: Affect normal.    Recent Results (from the past 2160 hour(s))  PULMONARY FUNCTION TEST     Status: None   Collection Time    02/12/14  2:33 PM      Result Value Ref Range   FVC-Pre 3.08     FVC-%Pred-Pre 78     FVC-Post 3.06     FVC-%Pred-Post 78     FVC-%Change-Post 0     FEV1-Pre 2.49     FEV1-%Pred-Pre 83     FEV1-Post 2.56     FEV1-%Pred-Post 85     FEV1-%Change-Post 2     FEV6-Pre 3.07     FEV6-%Pred-Pre 81     FEV6-Post 3.06     FEV6-%Pred-Post 81     FEV6-%Change-Post 0     Pre FEV1/FVC ratio 81     FEV1FVC-%Pred-Pre 105     Post FEV1/FVC ratio 84     FEV1FVC-%Change-Post 3     Pre FEV6/FVC Ratio 100     FEV6FVC-%Pred-Pre 103     Post FEV6/FVC ratio 100     FEV6FVC-%Pred-Post 104     FEV6FVC-%Change-Post 0     FEF 25-75 Pre 2.45     FEF2575-%Pred-Pre 99     FEF 25-75 Post 2.86     FEF2575-%Pred-Post 116     FEF2575-%Change-Post 16     RV 2.39  RV % pred 99     TLC 5.53     TLC % pred 92     DLCO unc 26.92     DLCO unc % pred 83     DL/VA 1.615.53      DL/VA % pred 096101    HEMOGLOBIN A1C     Status: Abnormal   Collection Time    05/01/14  8:43 AM      Result Value Ref Range   Hemoglobin A1C 7.3 (*) 4.6 - 6.5 %   Comment: Glycemic Control Guidelines for People with Diabetes:Non Diabetic:  <6%Goal of Therapy: <7%Additional Action Suggested:  >8%   COMPREHENSIVE METABOLIC PANEL     Status: Abnormal   Collection Time    05/01/14  8:43 AM      Result Value Ref Range   Sodium 139  135 - 145 mEq/L   Potassium 4.5  3.5 - 5.1 mEq/L   Chloride 106  96 - 112 mEq/L   CO2 27  19 - 32 mEq/L   Glucose, Bld 135 (*) 70 - 99 mg/dL   BUN 20  6 - 23 mg/dL   Creatinine, Ser 0.8  0.4 - 1.2 mg/dL   Total Bilirubin 0.7  0.2 - 1.2 mg/dL   Alkaline Phosphatase 40  39 - 117 U/L   AST 19  0 - 37 U/L   ALT 31  0 - 35 U/L   Total Protein 6.5  6.0 - 8.3 g/dL   Albumin 3.9  3.5 - 5.2 g/dL   Calcium 9.6  8.4 - 04.510.5 mg/dL   GFR 40.9874.14  >11.91>60.00 mL/min  LIPID PANEL     Status: Abnormal   Collection Time    05/01/14  8:43 AM      Result Value Ref Range   Cholesterol 181  0 - 200 mg/dL   Comment: ATP III Classification       Desirable:  < 200 mg/dL               Borderline High:  200 - 239 mg/dL          High:  > = 478240 mg/dL   Triglycerides 295.6200.0 (*) 0.0 - 149.0 mg/dL   Comment: Normal:  <213<150 mg/dLBorderline High:  150 - 199 mg/dL   HDL 08.6535.90 (*) >78.46>39.00 mg/dL   VLDL 96.240.0  0.0 - 95.240.0 mg/dL   LDL Cholesterol 841105 (*) 0 - 99 mg/dL   Total CHOL/HDL Ratio 5     Comment:                Men          Women1/2 Average Risk     3.4          3.3Average Risk          5.0          4.42X Average Risk          9.6          7.13X Average Risk          15.0          11.0                       NonHDL 145.10    MICROALBUMIN / CREATININE URINE RATIO     Status: Abnormal   Collection Time    05/01/14  8:43 AM      Result Value Ref Range   Microalb, Ur 2.4 (*)  0.0 - 1.9 mg/dL   Creatinine,U 78.295.7     Microalb Creat Ratio 2.5  0.0 - 30.0 mg/g    Assessment/Plan: Viral URI  with cough Increase fluid intake.  Rest.  Saline nasal spray.  Plain Mucinex. Humidifier in bedroom. Take a daily multivitamin and Probiotic. Call or return to clinic if symptoms persist.

## 2014-05-15 ENCOUNTER — Encounter: Payer: Self-pay | Admitting: Family Medicine

## 2014-05-15 ENCOUNTER — Telehealth: Payer: Self-pay | Admitting: Family Medicine

## 2014-05-15 DIAGNOSIS — J209 Acute bronchitis, unspecified: Secondary | ICD-10-CM

## 2014-05-15 NOTE — Telephone Encounter (Signed)
Patient was seen last week for sore throat and congestion. She states that she is now worse and would like something called into the pharmacy

## 2014-05-16 MED ORDER — CEFDINIR 300 MG PO CAPS: 300.0000 mg | ORAL_CAPSULE | Freq: Two times a day (BID) | ORAL | Status: DC

## 2014-05-16 NOTE — Telephone Encounter (Signed)
Patient called in stating that she is upset that no one ever called her back regarding this. She states that z-packs do not work on her and she cannot take amoxicillan. She would like to a callback from a nurse to because she has additional questions. Please call patient.  [facility] on [address]

## 2014-05-16 NOTE — Telephone Encounter (Signed)
Rx Cefdinir 300 mg BID x 7 days. Reiterated conservative measures.  Return to clinic if symptoms are not improving within 48 hours.

## 2014-05-22 ENCOUNTER — Telehealth: Payer: Self-pay | Admitting: Family Medicine

## 2014-05-22 NOTE — Telephone Encounter (Signed)
Informed patient of this. Lab results should be in system because dr. Lucianne Musskumar is with [facility]

## 2014-05-22 NOTE — Telephone Encounter (Signed)
Patient has an appointment for next week and is supposed to do labs prior. Please enter lab orders. Also, patient states that she just had labs drawn that was ordered by dr Lucianne MussKumar. She does not want to have the same labs drawn again.

## 2014-05-22 NOTE — Telephone Encounter (Signed)
Please inform pt to wait for labs until after she sees Dr Abner GreenspanBlyth. And to get a copy of the labs Dr Lucianne MussKumar did so Dr B can decide what/if any need to be ordered

## 2014-06-01 ENCOUNTER — Ambulatory Visit (INDEPENDENT_AMBULATORY_CARE_PROVIDER_SITE_OTHER): Payer: Medicare Other | Admitting: Family Medicine

## 2014-06-01 ENCOUNTER — Encounter: Payer: Self-pay | Admitting: Family Medicine

## 2014-06-01 VITALS — BP 110/64 | HR 88 | Temp 98.1°F | Ht 70.0 in

## 2014-06-01 DIAGNOSIS — L309 Dermatitis, unspecified: Secondary | ICD-10-CM

## 2014-06-01 DIAGNOSIS — E785 Hyperlipidemia, unspecified: Secondary | ICD-10-CM

## 2014-06-01 DIAGNOSIS — I10 Essential (primary) hypertension: Secondary | ICD-10-CM

## 2014-06-01 DIAGNOSIS — E039 Hypothyroidism, unspecified: Secondary | ICD-10-CM

## 2014-06-01 DIAGNOSIS — M109 Gout, unspecified: Secondary | ICD-10-CM | POA: Insufficient documentation

## 2014-06-01 DIAGNOSIS — L259 Unspecified contact dermatitis, unspecified cause: Secondary | ICD-10-CM

## 2014-06-01 HISTORY — DX: Dermatitis, unspecified: L30.9

## 2014-06-01 LAB — CBC
HCT: 39.1 % (ref 36.0–46.0)
Hemoglobin: 12.8 g/dL (ref 12.0–15.0)
MCH: 25.3 pg — AB (ref 26.0–34.0)
MCHC: 32.7 g/dL (ref 30.0–36.0)
MCV: 77.4 fL — ABNORMAL LOW (ref 78.0–100.0)
PLATELETS: 317 10*3/uL (ref 150–400)
RBC: 5.05 MIL/uL (ref 3.87–5.11)
RDW: 15.5 % (ref 11.5–15.5)
WBC: 8.3 10*3/uL (ref 4.0–10.5)

## 2014-06-01 NOTE — Assessment & Plan Note (Signed)
Improving check uric acid levels, increase hydration

## 2014-06-01 NOTE — Assessment & Plan Note (Signed)
Tolerating statin, encouraged heart healthy diet, avoid trans fats, minimize simple carbs and saturated fats. Increase exercise as tolerated 

## 2014-06-01 NOTE — Assessment & Plan Note (Signed)
On Levothyroxine, continue to monitor 

## 2014-06-01 NOTE — Assessment & Plan Note (Signed)
Well controlled, no changes to meds. Encouraged heart healthy diet such as the DASH diet and exercise as tolerated.  °

## 2014-06-01 NOTE — Progress Notes (Signed)
Pre visit review using our clinic review tool, if applicable. No additional management support is needed unless otherwise documented below in the visit note. 

## 2014-06-01 NOTE — Progress Notes (Signed)
Patient ID: Crystal Hodge, female   DOB: August 03, 1946, 68 y.o.   MRN: [id number] Crystal Hodge [facility] [id number] Jul 14, 1946 06/01/2014      Progress Note-Follow Up  Subjective  Chief Complaint  Chief Complaint  Patient presents with  . Follow-up    3 month    HPI  Patient is a 68 year old female in today for routine medical care. 31. Essentially been struggling with pruritic panels. 2 recent increased pain, swelling and redness and great toes. It is improving today. She believes it is a gouty flare. Recently had a sinusitis treated with good results. Denies CP/palp/SOB/HA/congestion/fevers/GI or GU c/o. Taking meds as prescribed  Past Medical History  Diagnosis Date  . Diabetes mellitus     type II  . Hypertension   . Hyperlipidemia   . Thyroid disease   . Hypothyroidism   . Rectocele   . Menopause   . Obesity   . Tremor 04/07/2011  . Staph infection 09/12/2013  . Rectocele 09/12/2013  . Preventative health care 09/16/2013  . Acute upper respiratory infections of unspecified site 09/16/2013  . Dyspnea 11/19/2013  . Cervical cancer screening 01/30/2014  . Sun-damaged skin 02/04/2014    Past Surgical History  Procedure Laterality Date  . Abdominal hysterectomy  07/28/95  . Carpal tunnel release  10/07    Right    Family History  Problem Relation Age of Onset  . Hyperlipidemia Mother   . Mental illness Mother   . Dementia Mother   . Hyperlipidemia Father   . Heart disease Father     triple bypass  . Diabetes Son     ?type I  . Mental illness Son     bipolar  . Depression Maternal Aunt   . Stroke Maternal Grandmother   . Mental illness Son   . Mental illness Brother     bipolar    History   Social History  . Marital Status: Widowed    Spouse Name: N/A    Number of Children: 2  . Years of Education: HS   Occupational History  . Retired    Social History Main Topics  . Smoking status: Former Smoker -- 0.50 packs/day for 30 years    Types: Cigarettes    Quit  date: 11/23/1990  . Smokeless tobacco: Never Used  . Alcohol Use: Yes     Comment: occasional  . Drug Use: No  . Sexual Activity: Not Currently    Partners: Male   Other Topics Concern  . Not on file   Social History Narrative   Caffeine: 1 mug coffee daily   Regular exercise: plays tennis.          Current Outpatient Prescriptions on File Prior to Visit  Medication Sig Dispense Refill  . aspirin 81 MG tablet Take 81 mg by mouth every morning.        Marland Kitchen atorvastatin (LIPITOR) 20 MG tablet Take 1 tablet (20 mg total) by mouth every evening.  90 tablet  1  . benazepril (LOTENSIN) 10 MG tablet Take 1 tablet (10 mg total) by mouth daily.  90 tablet  1  . cefdinir (OMNICEF) 300 MG capsule Take 1 capsule (300 mg total) by mouth 2 (two) times daily.  14 capsule  0  . Coenzyme Q10 (CO Q 10) 10 MG CAPS Take 100 mg by mouth every morning.        . docusate sodium (COLACE) 100 MG capsule Take 100 mg by mouth 2 (two) times daily.        Marland Kitchen  Fiber CAPS Take 1 capsule by mouth every morning.        Marland Kitchen glimepiride (AMARYL) 1 MG tablet Take 1 tablet (1 mg total) by mouth daily before supper.  90 tablet  1  . glucose blood (FREESTYLE LITE) test strip Use as instructed to check blood sugars 2 times per day dx code 250.00  100 each  12  . levothyroxine (SYNTHROID, LEVOTHROID) 100 MCG tablet Take 1 tablet (100 mcg total) by mouth daily.  90 tablet  1  . metFORMIN (GLUCOPHAGE) 1000 MG tablet Take 1 tablet (1,000 mg total) by mouth 2 (two) times daily.  180 tablet  1  . mupirocin ointment (BACTROBAN) 2 % Apply topically daily. To nares via clean qtip x 7 days prn nasal lesions at bedtime  22 g  0  . NON FORMULARY 1 tsp honey with Manuka every morning.       Marland Kitchen PROAIR HFA 108 (90 BASE) MCG/ACT inhaler Inhale 2 puffs into the lungs every 6 (six) hours as needed for wheezing or shortness of breath.  1 Inhaler  2  . triamterene-hydrochlorothiazide (DYAZIDE) 37.5-25 MG per capsule Take 1 each (1 capsule total) by  mouth daily.  90 capsule  1  . triamterene-hydrochlorothiazide (MAXZIDE-25) 37.5-25 MG per tablet Take one-half tablet by mouth daily  45 tablet  1   No current facility-administered medications on file prior to visit.    Allergies  Allergen Reactions  . Amoxicillin Diarrhea  . Prednisone     Hallucinations- can't take oral    Review of Systems  Review of Systems  Constitutional: Negative for fever and malaise/fatigue.  HENT: Negative for congestion.   Eyes: Negative for discharge.  Respiratory: Negative for shortness of breath.   Cardiovascular: Negative for chest pain, palpitations and leg swelling.  Gastrointestinal: Negative for nausea, abdominal pain and diarrhea.  Genitourinary: Negative for dysuria.  Musculoskeletal: Negative for falls.  Skin: Negative for rash.  Neurological: Negative for loss of consciousness and headaches.  Endo/Heme/Allergies: Negative for polydipsia.  Psychiatric/Behavioral: Negative for depression and suicidal ideas. The patient is not nervous/anxious and does not have insomnia.     Objective  BP 110/64  Pulse 88  Temp(Src) 98.1 F (36.7 C) (Oral)  Ht 5\' 10"  (1.778 m)  SpO2 97%  Physical Exam  Physical Exam  Constitutional: She is oriented to person, place, and time and well-developed, well-nourished, and in no distress. No distress.  HENT:  Head: Normocephalic and atraumatic.  Eyes: Conjunctivae are normal.  Neck: Neck supple. No thyromegaly present.  Cardiovascular: Normal rate, regular rhythm and normal heart sounds.   No murmur heard. Pulmonary/Chest: Effort normal and breath sounds normal. She has no wheezes.  Abdominal: She exhibits no distension and no mass.  Musculoskeletal: She exhibits no edema.  Lymphadenopathy:    She has no cervical adenopathy.  Neurological: She is alert and oriented to person, place, and time.  Skin: Skin is warm and dry. No rash noted. She is not diaphoretic.  Psychiatric: Memory, affect and  judgment normal.    Lab Results  Component Value Date   TSH 2.34 10/02/2013   Lab Results  Component Value Date   WBC 11.0* 05/14/2011   HGB 13.4 05/14/2011   HCT 43.7 05/14/2011   MCV 80.8 05/14/2011   PLT 361 05/14/2011   Lab Results  Component Value Date   CREATININE 0.8 05/01/2014   BUN 20 05/01/2014   NA 139 05/01/2014   K 4.5 05/01/2014   CL 106 05/01/2014  CO2 27 05/01/2014   Lab Results  Component Value Date   ALT 31 05/01/2014   AST 19 05/01/2014   ALKPHOS 40 05/01/2014   BILITOT 0.7 05/01/2014   Lab Results  Component Value Date   CHOL 181 05/01/2014   Lab Results  Component Value Date   HDL 35.90* 05/01/2014   Lab Results  Component Value Date   LDLCALC 105* 05/01/2014   Lab Results  Component Value Date   TRIG 200.0* 05/01/2014   Lab Results  Component Value Date   CHOLHDL 5 05/01/2014     Assessment & Plan  HTN (hypertension) Well controlled, no changes to meds. Encouraged heart healthy diet such as the DASH diet and exercise as tolerated.   Hyperlipidemia Tolerating statin, encouraged heart healthy diet, avoid trans fats, minimize simple carbs and saturated fats. Increase exercise as tolerated  Unspecified hypothyroidism On Levothyroxine, continue to monitor  Acute gout Improving check uric acid levels, increase hydration  Dermatitis Recurrent papular, pruritic lesions. Unclear etiology consider return to dermatology if worsens

## 2014-06-01 NOTE — Patient Instructions (Signed)
Tsh. Cbc, uric acid prior to next visit.  Gout Gout is an inflammatory arthritis caused by a buildup of uric acid crystals in the joints. Uric acid is a chemical that is normally present in the blood. When the level of uric acid in the blood is too high it can form crystals that deposit in your joints and tissues. This causes joint redness, soreness, and swelling (inflammation). Repeat attacks are common. Over time, uric acid crystals can form into masses (tophi) near a joint, destroying bone and causing disfigurement. Gout is treatable and often preventable. CAUSES  The disease begins with elevated levels of uric acid in the blood. Uric acid is produced by your body when it breaks down a naturally found substance called purines. Certain foods you eat, such as meats and fish, contain high amounts of purines. Causes of an elevated uric acid level include:  Being passed down from parent to child (heredity).  Diseases that cause increased uric acid production (such as obesity, psoriasis, and certain cancers).  Excessive alcohol use.  Diet, especially diets rich in meat and seafood.  Medicines, including certain cancer-fighting medicines (chemotherapy), water pills (diuretics), and aspirin.  Chronic kidney disease. The kidneys are no longer able to remove uric acid well.  Problems with metabolism. Conditions strongly associated with gout include:  Obesity.  High blood pressure.  High cholesterol.  Diabetes. Not everyone with elevated uric acid levels gets gout. It is not understood why some people get gout and others do not. Surgery, joint injury, and eating too much of certain foods are some of the factors that can lead to gout attacks. SYMPTOMS   An attack of gout comes on quickly. It causes intense pain with redness, swelling, and warmth in a joint.  Fever can occur.  Often, only one joint is involved. Certain joints are more commonly involved:  Base of the big  toe.  Knee.  Ankle.  Wrist.  Finger. Without treatment, an attack usually goes away in a few days to weeks. Between attacks, you usually will not have symptoms, which is different from many other forms of arthritis. DIAGNOSIS  Your caregiver will suspect gout based on your symptoms and exam. In some cases, tests may be recommended. The tests may include:  Blood tests.  Urine tests.  X-rays.  Joint fluid exam. This exam requires a needle to remove fluid from the joint (arthrocentesis). Using a microscope, gout is confirmed when uric acid crystals are seen in the joint fluid. TREATMENT  There are two phases to gout treatment: treating the sudden onset (acute) attack and preventing attacks (prophylaxis).  Treatment of an Acute Attack.  Medicines are used. These include anti-inflammatory medicines or steroid medicines.  An injection of steroid medicine into the affected joint is sometimes necessary.  The painful joint is rested. Movement can worsen the arthritis.  You may use warm or cold treatments on painful joints, depending which works best for you.  Treatment to Prevent Attacks.  If you suffer from frequent gout attacks, your caregiver may advise preventive medicine. These medicines are started after the acute attack subsides. These medicines either help your kidneys eliminate uric acid from your body or decrease your uric acid production. You may need to stay on these medicines for a very long time.  The early phase of treatment with preventive medicine can be associated with an increase in acute gout attacks. For this reason, during the first few months of treatment, your caregiver may also advise you to take medicines  usually used for acute gout treatment. Be sure you understand your caregiver's directions. Your caregiver may make several adjustments to your medicine dose before these medicines are effective.  Discuss dietary treatment with your caregiver or dietitian.  Alcohol and drinks high in sugar and fructose and foods such as meat, poultry, and seafood can increase uric acid levels. Your caregiver or dietician can advise you on drinks and foods that should be limited. HOME CARE INSTRUCTIONS   Do not take aspirin to relieve pain. This raises uric acid levels.  Only take over-the-counter or prescription medicines for pain, discomfort, or fever as directed by your caregiver.  Rest the joint as much as possible. When in bed, keep sheets and blankets off painful areas.  Keep the affected joint raised (elevated).  Apply warm or cold treatments to painful joints. Use of warm or cold treatments depends on which works best for you.  Use crutches if the painful joint is in your leg.  Drink enough fluids to keep your urine clear or pale yellow. This helps your body get rid of uric acid. Limit alcohol, sugary drinks, and fructose drinks.  Follow your dietary instructions. Pay careful attention to the amount of protein you eat. Your daily diet should emphasize fruits, vegetables, whole grains, and fat-free or low-fat milk products. Discuss the use of coffee, vitamin C, and cherries with your caregiver or dietician. These may be helpful in lowering uric acid levels.  Maintain a healthy body weight. SEEK MEDICAL CARE IF:   You develop diarrhea, vomiting, or any side effects from medicines.  You do not feel better in 24 hours, or you are getting worse. SEEK IMMEDIATE MEDICAL CARE IF:   Your joint becomes suddenly more tender, and you have chills or a fever. MAKE SURE YOU:   Understand these instructions.  Will watch your condition.  Will get help right away if you are not doing well or get worse. Document Released: 11/06/2000 Document Revised: 03/06/2013 Document Reviewed: 06/22/2012 [facility] Patient Information 2015 [place], [place]. This information is not intended to replace advice given to you by your health care provider. Make sure you discuss any  questions you have with your health care provider.

## 2014-06-01 NOTE — Assessment & Plan Note (Signed)
Recurrent papular, pruritic lesions. Unclear etiology consider return to dermatology if worsens

## 2014-06-02 LAB — TSH: TSH: 2.562 u[IU]/mL (ref 0.350–4.500)

## 2014-07-31 ENCOUNTER — Other Ambulatory Visit (INDEPENDENT_AMBULATORY_CARE_PROVIDER_SITE_OTHER): Payer: Medicare Other

## 2014-07-31 DIAGNOSIS — IMO0001 Reserved for inherently not codable concepts without codable children: Secondary | ICD-10-CM

## 2014-07-31 DIAGNOSIS — E785 Hyperlipidemia, unspecified: Secondary | ICD-10-CM

## 2014-07-31 DIAGNOSIS — R7989 Other specified abnormal findings of blood chemistry: Secondary | ICD-10-CM

## 2014-07-31 DIAGNOSIS — E1165 Type 2 diabetes mellitus with hyperglycemia: Principal | ICD-10-CM

## 2014-07-31 LAB — BASIC METABOLIC PANEL
BUN: 19 mg/dL (ref 6–23)
CO2: 25 mEq/L (ref 19–32)
Calcium: 9.3 mg/dL (ref 8.4–10.5)
Chloride: 104 mEq/L (ref 96–112)
Creatinine, Ser: 1 mg/dL (ref 0.4–1.2)
GFR: 59.6 mL/min — AB (ref >60.00)
GLUCOSE: 101 mg/dL — AB (ref 70–99)
Potassium: 4.3 mEq/L (ref 3.5–5.1)
Sodium: 137 mEq/L (ref 135–145)

## 2014-07-31 LAB — HEMOGLOBIN A1C: Hgb A1c MFr Bld: 7.3 % — ABNORMAL HIGH (ref 4.6–6.5)

## 2014-07-31 LAB — MICROALBUMIN / CREATININE URINE RATIO
CREATININE, U: 140.2 mg/dL
Microalb Creat Ratio: 0.5 mg/g (ref 0.0–30.0)
Microalb, Ur: 0.7 mg/dL (ref 0.0–1.9)

## 2014-07-31 LAB — LDL CHOLESTEROL, DIRECT: Direct LDL: 107.2 mg/dL

## 2014-08-02 ENCOUNTER — Encounter: Payer: Self-pay | Admitting: Endocrinology

## 2014-08-02 ENCOUNTER — Ambulatory Visit (INDEPENDENT_AMBULATORY_CARE_PROVIDER_SITE_OTHER): Payer: Medicare Other | Admitting: Endocrinology

## 2014-08-02 VITALS — BP 116/68 | HR 92 | Temp 98.3°F | Resp 16 | Ht 70.0 in

## 2014-08-02 DIAGNOSIS — I10 Essential (primary) hypertension: Secondary | ICD-10-CM

## 2014-08-02 DIAGNOSIS — E785 Hyperlipidemia, unspecified: Secondary | ICD-10-CM

## 2014-08-02 DIAGNOSIS — E1165 Type 2 diabetes mellitus with hyperglycemia: Principal | ICD-10-CM

## 2014-08-02 DIAGNOSIS — E039 Hypothyroidism, unspecified: Secondary | ICD-10-CM

## 2014-08-02 DIAGNOSIS — IMO0001 Reserved for inherently not codable concepts without codable children: Secondary | ICD-10-CM

## 2014-08-02 NOTE — Progress Notes (Signed)
Patient ID: Crystal Hodge, female   DOB: 1945-12-22, 68 y.o.   MRN: [id number]   Reason for Appointment: Diabetes follow-up   History of Present Illness    Diagnosis: Type 2 diabetes mellitus, date of diagnosis: 2008.   She has been on a regimen of Amaryl and metformin; was on Janumet but did not continue because of the cost especially in the donut hole Her A1c has been consistently just over 7% and now same as on her last visit She has difficulty being consistent with her diet regimen and weight control Again recently has had fluctuations in her weight and she thinks she goes off her diet when she is eating out or traveling She takes Amaryl in the evening to avoid hypoglycemia midday but still has occasional mild symptoms of hypoglycemia around lunchtime which is better if she has consistent amount of protein in the morning She also has been inconsistent again on monitoring her blood sugar and has only 3 readings recently She thinks her weight is recently better although she refuses to get weighed again in the office  Monitors blood glucose: Less than once a day and recent readings are in the 100s  Glucometer: Freestyle.  Hypoglycemia: Rarely before lunch  Meals: Eats every 4 hrs, will have eggs or other protein with her breakfast, portion control less   Physical activity: exercise: At the gym periodically; playing tennis, walking.   Dietician visit: Most recent:5/11.   Complications: peripheral neuropathy, ? retinopathy.   Lab Results  Component Value Date   HGBA1C 7.3* 07/31/2014   HGBA1C 7.3* 05/01/2014   HGBA1C 7.4* 01/30/2014   Lab Results  Component Value Date   MICROALBUR 0.7 07/31/2014   LDLCALC 105* 05/01/2014   CREATININE 1.0 07/31/2014        HYPERLIPIDEMIA:         The lipid abnormality consists of elevated LDL and low HDL. She has been compliant with her Lipitor but not consistent on her diet and both LDL and triglycerides are higher  Lab Results  Component Value Date    CHOL 181 05/01/2014   HDL 35.90* 05/01/2014   LDLCALC 105* 05/01/2014   LDLDIRECT 107.2 07/31/2014   TRIG 200.0* 05/01/2014   CHOLHDL 5 05/01/2014    HYPOTHYROIDISM: She is compliant with her thyroid supplement and has no unusual fatigue.  TSH is usually normal with 100 mcg levothyroxine  Lab Results  Component Value Date   TSH 2.562 06/01/2014      Appointment on 07/31/2014  Component Date Value Ref Range Status  . Microalb, Ur 07/31/2014 0.7  0.0 - 1.9 mg/dL Final  . Creatinine,U 40/98/1191 140.2   Final  . Microalb Creat Ratio 07/31/2014 0.5  0.0 - 30.0 mg/g Final  . Hemoglobin A1C 07/31/2014 7.3* 4.6 - 6.5 % Final   Glycemic Control Guidelines for People with Diabetes:Non Diabetic:  <6%Goal of Therapy: <7%Additional Action Suggested:  >8%   . Sodium 07/31/2014 137  135 - 145 mEq/L Final  . Potassium 07/31/2014 4.3  3.5 - 5.1 mEq/L Final  . Chloride 07/31/2014 104  96 - 112 mEq/L Final  . CO2 07/31/2014 25  19 - 32 mEq/L Final  . Glucose, Bld 07/31/2014 101* 70 - 99 mg/dL Final  . BUN 47/82/9562 19  6 - 23 mg/dL Final  . Creatinine, Ser 07/31/2014 1.0  0.4 - 1.2 mg/dL Final  . Calcium 13/06/6577 9.3  8.4 - 10.5 mg/dL Final  . GFR 46/96/2952 59.60* >60.00 mL/min Final  . Direct  LDL 07/31/2014 107.2   Final   Optimal:  <100 mg/dLNear or Above Optimal:  100-129 mg/dLBorderline High:  130-159 mg/dLHigh:  160-189 mg/dLVery High:  >190 mg/dL      Medication List       This list is accurate as of: 08/02/14 11:20 AM.  Always use your most recent med list.               aspirin 81 MG tablet  Take 81 mg by mouth every morning.     atorvastatin 20 MG tablet  Commonly known as:  LIPITOR  Take 1 tablet (20 mg total) by mouth every evening.     benazepril 10 MG tablet  Commonly known as:  LOTENSIN  Take 1 tablet (10 mg total) by mouth daily.     cefdinir 300 MG capsule  Commonly known as:  OMNICEF  Take 1 capsule (300 mg total) by mouth 2 (two) times daily.     Co Q 10 10 MG  Caps  Take 100 mg by mouth every morning.     docusate sodium 100 MG capsule  Commonly known as:  COLACE  Take 100 mg by mouth 2 (two) times daily.     Fiber Caps  Take 1 capsule by mouth every morning.     glimepiride 1 MG tablet  Commonly known as:  AMARYL  Take 1 tablet (1 mg total) by mouth daily before supper.     glucose blood test strip  Commonly known as:  FREESTYLE LITE  Use as instructed to check blood sugars 2 times per day dx code 250.00     levothyroxine 100 MCG tablet  Commonly known as:  SYNTHROID, LEVOTHROID  Take 1 tablet (100 mcg total) by mouth daily.     metFORMIN 1000 MG tablet  Commonly known as:  GLUCOPHAGE  Take 1 tablet (1,000 mg total) by mouth 2 (two) times daily.     mupirocin ointment 2 %  Commonly known as:  BACTROBAN  Apply topically daily. To nares via clean qtip x 7 days prn nasal lesions at bedtime     NON FORMULARY  1 tsp honey with Manuka every morning.     PROAIR HFA 108 (90 BASE) MCG/ACT inhaler  Generic drug:  albuterol  Inhale 2 puffs into the lungs every 6 (six) hours as needed for wheezing or shortness of breath.     triamterene-hydrochlorothiazide 37.5-25 MG per capsule  Commonly known as:  DYAZIDE  Take 1 each (1 capsule total) by mouth daily.     valACYclovir 1000 MG tablet  Commonly known as:  VALTREX        Allergies:  Allergies  Allergen Reactions  . Amoxicillin Diarrhea  . Prednisone     Hallucinations- can't take oral    Past Medical History  Diagnosis Date  . Diabetes mellitus     type II  . Hypertension   . Hyperlipidemia   . Thyroid disease   . Hypothyroidism   . Rectocele   . Menopause   . Obesity   . Tremor 04/07/2011  . Staph infection 09/12/2013  . Rectocele 09/12/2013  . Preventative health care 09/16/2013  . Acute upper respiratory infections of unspecified site 09/16/2013  . Dyspnea 11/19/2013  . Cervical cancer screening 01/30/2014  . Sun-damaged skin 02/04/2014  . Dermatitis 06/01/2014     Past Surgical History  Procedure Laterality Date  . Abdominal hysterectomy  07/28/95  . Carpal tunnel release  10/07    Right  Family History  Problem Relation Age of Onset  . Hyperlipidemia Mother   . Mental illness Mother   . Dementia Mother   . Hyperlipidemia Father   . Heart disease Father     triple bypass  . Diabetes Son     ?type I  . Mental illness Son     bipolar  . Depression Maternal Aunt   . Stroke Maternal Grandmother   . Mental illness Son   . Mental illness Brother     bipolar    Social History:  reports that she quit smoking about 23 years ago. Her smoking use included Cigarettes. She has a 15 pack-year smoking history. She has never used smokeless tobacco. She reports that she drinks alcohol. She reports that she does not use illicit drugs.  Review of Systems   She has long-standing hypothyroidism, has no unusual fatigue and is compliant with her medication TSH is usually normal  Lab Results  Component Value Date   TSH 2.562 06/01/2014      HYPERTENSION:  blood pressure is well controlled with using Lotensin 10 mg   No history of numbness or tingling/burning in her feet, diabetic Foot exam done in 11/14    Examination:   BP 116/68  Pulse 92  Temp(Src) 98.3 F (36.8 C)  Resp 16  Ht  (1.778 m)  SpO2 96%  Body mass index is 0.00 kg/(m^2).    no ankle edema  Assesment/PLAN:  1. Diabetes type 2, uncontrolled   The patient's diabetes control appears to be about the same and A1c is still over 7% She is still only on low dose Amaryl Most likely has some postprandial hyperglycemia but is not motivated to monitor her glucose She thinks she can be more consistent with diet and has been doing some exercise recently Also since she is tolerating the regular metformin maximum dose will continue this Followup in 3 months with A1c again  2. Hypothyroidism: Adequately replaced but will need followup TSH on the next visit  3. Hypertension:  Currently controlled  with Lotensin and Maxzide and no hypokalemia    Reannon Candella 08/02/2014, 11:20 AM

## 2014-08-02 NOTE — Patient Instructions (Signed)
Please check blood sugars at least half the time about 2 hours after any meal and 2 times per week on waking up. Please bring blood sugar monitor to each visit  

## 2014-08-21 ENCOUNTER — Other Ambulatory Visit: Payer: Self-pay | Admitting: *Deleted

## 2014-08-21 MED ORDER — LEVOTHYROXINE SODIUM 100 MCG PO TABS: 100.0000 ug | ORAL_TABLET | Freq: Every day | ORAL | Status: DC

## 2014-08-21 MED ORDER — ATORVASTATIN CALCIUM 20 MG PO TABS: 20.0000 mg | ORAL_TABLET | Freq: Every evening | ORAL | Status: DC

## 2014-08-21 MED ORDER — GLIMEPIRIDE 1 MG PO TABS: 1.0000 mg | ORAL_TABLET | Freq: Every day | ORAL | Status: DC

## 2014-08-21 MED ORDER — BENAZEPRIL HCL 10 MG PO TABS: 10.0000 mg | ORAL_TABLET | Freq: Every day | ORAL | Status: DC

## 2014-08-28 ENCOUNTER — Telehealth: Payer: Self-pay

## 2014-08-28 NOTE — Telephone Encounter (Signed)
Left a message for call back.  Called patient regarding diabetic eye exam.  When patient calls back please ask:  Have you had a recent (2014-2015) eye exam?    Date of Exam?  Where?    

## 2014-09-20 ENCOUNTER — Ambulatory Visit: Payer: Medicare Other | Admitting: Family Medicine

## 2014-09-21 NOTE — Telephone Encounter (Signed)
Pt states she had an eye exam this year at [facility].  Called office and message stated that they were closed.

## 2014-09-24 ENCOUNTER — Encounter: Payer: Self-pay | Admitting: Endocrinology

## 2014-09-27 NOTE — Telephone Encounter (Signed)
Requested copy of last eye exam.  Awaiting fax.   

## 2014-09-28 ENCOUNTER — Other Ambulatory Visit: Payer: Self-pay | Admitting: *Deleted

## 2014-09-28 MED ORDER — LEVOTHYROXINE SODIUM 100 MCG PO TABS: 100.0000 ug | ORAL_TABLET | Freq: Every day | ORAL | Status: DC

## 2014-09-28 MED ORDER — ATORVASTATIN CALCIUM 20 MG PO TABS: 20.0000 mg | ORAL_TABLET | Freq: Every evening | ORAL | Status: DC

## 2014-09-28 NOTE — Telephone Encounter (Signed)
Fax received

## 2014-10-01 ENCOUNTER — Ambulatory Visit: Payer: Medicare Other

## 2014-10-01 ENCOUNTER — Other Ambulatory Visit: Payer: Self-pay | Admitting: *Deleted

## 2014-10-01 ENCOUNTER — Ambulatory Visit: Payer: Medicare Other | Admitting: *Deleted

## 2014-10-01 DIAGNOSIS — Z23 Encounter for immunization: Secondary | ICD-10-CM

## 2014-10-26 ENCOUNTER — Ambulatory Visit (INDEPENDENT_AMBULATORY_CARE_PROVIDER_SITE_OTHER): Payer: Medicare Other | Admitting: Ophthalmology

## 2014-10-30 ENCOUNTER — Encounter: Payer: Self-pay | Admitting: Family Medicine

## 2014-10-30 ENCOUNTER — Ambulatory Visit (INDEPENDENT_AMBULATORY_CARE_PROVIDER_SITE_OTHER): Payer: Medicare Other | Admitting: Family Medicine

## 2014-10-30 VITALS — BP 117/70 | HR 71 | Temp 98.1°F | Ht 70.0 in | Wt 232.8 lb

## 2014-10-30 DIAGNOSIS — F418 Other specified anxiety disorders: Secondary | ICD-10-CM

## 2014-10-30 DIAGNOSIS — Z0000 Encounter for general adult medical examination without abnormal findings: Secondary | ICD-10-CM

## 2014-10-30 DIAGNOSIS — F419 Anxiety disorder, unspecified: Secondary | ICD-10-CM

## 2014-10-30 DIAGNOSIS — F32A Depression, unspecified: Secondary | ICD-10-CM

## 2014-10-30 DIAGNOSIS — E785 Hyperlipidemia, unspecified: Secondary | ICD-10-CM

## 2014-10-30 DIAGNOSIS — R1032 Left lower quadrant pain: Secondary | ICD-10-CM

## 2014-10-30 DIAGNOSIS — E1169 Type 2 diabetes mellitus with other specified complication: Secondary | ICD-10-CM

## 2014-10-30 DIAGNOSIS — E039 Hypothyroidism, unspecified: Secondary | ICD-10-CM

## 2014-10-30 DIAGNOSIS — E669 Obesity, unspecified: Secondary | ICD-10-CM

## 2014-10-30 DIAGNOSIS — E119 Type 2 diabetes mellitus without complications: Secondary | ICD-10-CM

## 2014-10-30 DIAGNOSIS — I10 Essential (primary) hypertension: Secondary | ICD-10-CM

## 2014-10-30 DIAGNOSIS — F329 Major depressive disorder, single episode, unspecified: Secondary | ICD-10-CM

## 2014-10-30 MED ORDER — LORAZEPAM 0.5 MG PO TABS: 0.5000 mg | ORAL_TABLET | Freq: Two times a day (BID) | ORAL | Status: DC | PRN

## 2014-10-30 NOTE — Patient Instructions (Addendum)
Start a daily probiotic such as Digestive Advantage or [place] Benefiber powder daily to twice daily to firm up loose stool  Need to go for the colonoscopy due to recent bowel changes.    Preventive Care for Adults A healthy lifestyle and preventive care can promote health and wellness. Preventive health guidelines for women include the following key practices.  A routine yearly physical is a good way to check with your health care provider about your health and preventive screening. It is a chance to share any concerns and updates on your health and to receive a thorough exam.  Visit your dentist for a routine exam and preventive care every 6 months. Brush your teeth twice a day and floss once a day. Good oral hygiene prevents tooth decay and gum disease.  The frequency of eye exams is based on your age, health, family medical history, use of contact lenses, and other factors. Follow your health care provider's recommendations for frequency of eye exams.  Eat a healthy diet. Foods like vegetables, fruits, whole grains, low-fat dairy products, and lean protein foods contain the nutrients you need without too many calories. Decrease your intake of foods high in solid fats, added sugars, and salt. Eat the right amount of calories for you.Get information about a proper diet from your health care provider, if necessary.  Regular physical exercise is one of the most important things you can do for your health. Most adults should get at least 150 minutes of moderate-intensity exercise (any activity that increases your heart rate and causes you to sweat) each week. In addition, most adults need muscle-strengthening exercises on 2 or more days a week.  Maintain a healthy weight. The body mass index (BMI) is a screening tool to identify possible weight problems. It provides an estimate of body fat based on height and weight. Your health care provider can find your BMI and can help you  achieve or maintain a healthy weight.For adults 20 years and older:  A BMI below 18.5 is considered underweight.  A BMI of 18.5 to 24.9 is normal.  A BMI of 25 to 29.9 is considered overweight.  A BMI of 30 and above is considered obese.  Maintain normal blood lipids and cholesterol levels by exercising and minimizing your intake of saturated fat. Eat a balanced diet with plenty of fruit and vegetables. Blood tests for lipids and cholesterol should begin at age 51 and be repeated every 5 years. If your lipid or cholesterol levels are high, you are over 50, or you are at high risk for heart disease, you may need your cholesterol levels checked more frequently.Ongoing high lipid and cholesterol levels should be treated with medicines if diet and exercise are not working.  If you smoke, find out from your health care provider how to quit. If you do not use tobacco, do not start.  Lung cancer screening is recommended for adults aged 20-80 years who are at high risk for developing lung cancer because of a history of smoking. A yearly low-dose CT scan of the lungs is recommended for people who have at least a 30-pack-year history of smoking and are a current smoker or have quit within the past 15 years. A pack year of smoking is smoking an average of 1 pack of cigarettes a day for 1 year (for example: 1 pack a day for 30 years or 2 packs a day for 15 years). Yearly screening should continue until the smoker has stopped smoking for  at least 15 years. Yearly screening should be stopped for people who develop a health problem that would prevent them from having lung cancer treatment.  If you are pregnant, do not drink alcohol. If you are breastfeeding, be very cautious about drinking alcohol. If you are not pregnant and choose to drink alcohol, do not have more than 1 drink per day. One drink is considered to be 12 ounces (355 mL) of beer, 5 ounces (148 mL) of wine, or 1.5 ounces (44 mL) of liquor.  Avoid  use of street drugs. Do not share needles with anyone. Ask for help if you need support or instructions about stopping the use of drugs.  High blood pressure causes heart disease and increases the risk of stroke. Your blood pressure should be checked at least every 1 to 2 years. Ongoing high blood pressure should be treated with medicines if weight loss and exercise do not work.  If you are 10-22 years old, ask your health care provider if you should take aspirin to prevent strokes.  Diabetes screening involves taking a blood sample to check your fasting blood sugar level. This should be done once every 3 years, after age 73, if you are within normal weight and without risk factors for diabetes. Testing should be considered at a younger age or be carried out more frequently if you are overweight and have at least 1 risk factor for diabetes.  Breast cancer screening is essential preventive care for women. You should practice "breast self-awareness." This means understanding the normal appearance and feel of your breasts and may include breast self-examination. Any changes detected, no matter how small, should be reported to a health care provider. Women in their 76s and 30s should have a clinical breast exam (CBE) by a health care provider as part of a regular health exam every 1 to 3 years. After age 67, women should have a CBE every year. Starting at age 72, women should consider having a mammogram (breast X-ray test) every year. Women who have a family history of breast cancer should talk to their health care provider about genetic screening. Women at a high risk of breast cancer should talk to their health care providers about having an MRI and a mammogram every year.  Breast cancer gene (BRCA)-related cancer risk assessment is recommended for women who have family members with BRCA-related cancers. BRCA-related cancers include breast, ovarian, tubal, and peritoneal cancers. Having family members with  these cancers may be associated with an increased risk for harmful changes (mutations) in the breast cancer genes BRCA1 and BRCA2. Results of the assessment will determine the need for genetic counseling and BRCA1 and BRCA2 testing.  Routine pelvic exams to screen for cancer are no longer recommended for nonpregnant women who are considered low risk for cancer of the pelvic organs (ovaries, uterus, and vagina) and who do not have symptoms. Ask your health care provider if a screening pelvic exam is right for you.  If you have had past treatment for cervical cancer or a condition that could lead to cancer, you need Pap tests and screening for cancer for at least 20 years after your treatment. If Pap tests have been discontinued, your risk factors (such as having a new sexual partner) need to be reassessed to determine if screening should be resumed. Some women have medical problems that increase the chance of getting cervical cancer. In these cases, your health care provider may recommend more frequent screening and Pap tests.  The HPV  test is an additional test that may be used for cervical cancer screening. The HPV test looks for the virus that can cause the cell changes on the cervix. The cells collected during the Pap test can be tested for HPV. The HPV test could be used to screen women aged 43 years and older, and should be used in women of any age who have unclear Pap test results. After the age of 81, women should have HPV testing at the same frequency as a Pap test.  Colorectal cancer can be detected and often prevented. Most routine colorectal cancer screening begins at the age of 53 years and continues through age 38 years. However, your health care provider may recommend screening at an earlier age if you have risk factors for colon cancer. On a yearly basis, your health care provider may provide home test kits to check for hidden blood in the stool. Use of a small camera at the end of a tube, to  directly examine the colon (sigmoidoscopy or colonoscopy), can detect the earliest forms of colorectal cancer. Talk to your health care provider about this at age 41, when routine screening begins. Direct exam of the colon should be repeated every 5-10 years through age 17 years, unless early forms of pre-cancerous polyps or small growths are found.  People who are at an increased risk for hepatitis B should be screened for this virus. You are considered at high risk for hepatitis B if:  You were born in a country where hepatitis B occurs often. Talk with your health care provider about which countries are considered high risk.  Your parents were born in a high-risk country and you have not received a shot to protect against hepatitis B (hepatitis B vaccine).  You have HIV or AIDS.  You use needles to inject street drugs.  You live with, or have sex with, someone who has hepatitis B.  You get hemodialysis treatment.  You take certain medicines for conditions like cancer, organ transplantation, and autoimmune conditions.  Hepatitis C blood testing is recommended for all people born from [birth year] through 1965 and any individual with known risks for hepatitis C.  Practice safe sex. Use condoms and avoid high-risk sexual practices to reduce the spread of sexually transmitted infections (STIs). STIs include gonorrhea, chlamydia, syphilis, trichomonas, herpes, HPV, and human immunodeficiency virus (HIV). Herpes, HIV, and HPV are viral illnesses that have no cure. They can result in disability, cancer, and death.  You should be screened for sexually transmitted illnesses (STIs) including gonorrhea and chlamydia if:  You are sexually active and are younger than 24 years.  You are older than 24 years and your health care provider tells you that you are at risk for this type of infection.  Your sexual activity has changed since you were last screened and you are at an increased risk for chlamydia or  gonorrhea. Ask your health care provider if you are at risk.  If you are at risk of being infected with HIV, it is recommended that you take a prescription medicine daily to prevent HIV infection. This is called preexposure prophylaxis (PrEP). You are considered at risk if:  You are a heterosexual woman, are sexually active, and are at increased risk for HIV infection.  You take drugs by injection.  You are sexually active with a partner who has HIV.  Talk with your health care provider about whether you are at high risk of being infected with HIV. If you choose to  begin PrEP, you should first be tested for HIV. You should then be tested every 3 months for as long as you are taking PrEP.  Osteoporosis is a disease in which the bones lose minerals and strength with aging. This can result in serious bone fractures or breaks. The risk of osteoporosis can be identified using a bone density scan. Women ages 26 years and over and women at risk for fractures or osteoporosis should discuss screening with their health care providers. Ask your health care provider whether you should take a calcium supplement or vitamin D to reduce the rate of osteoporosis.  Menopause can be associated with physical symptoms and risks. Hormone replacement therapy is available to decrease symptoms and risks. You should talk to your health care provider about whether hormone replacement therapy is right for you.  Use sunscreen. Apply sunscreen liberally and repeatedly throughout the day. You should seek shade when your shadow is shorter than you. Protect yourself by wearing long sleeves, pants, a wide-brimmed hat, and sunglasses year round, whenever you are outdoors.  Once a month, do a whole body skin exam, using a mirror to look at the skin on your back. Tell your health care provider of new moles, moles that have irregular borders, moles that are larger than a pencil eraser, or moles that have changed in shape or  color.  Stay current with required vaccines (immunizations).  Influenza vaccine. All adults should be immunized every year.  Tetanus, diphtheria, and acellular pertussis (Td, Tdap) vaccine. Pregnant women should receive 1 dose of Tdap vaccine during each pregnancy. The dose should be obtained regardless of the length of time since the last dose. Immunization is preferred during the 27th-36th week of gestation. An adult who has not previously received Tdap or who does not know her vaccine status should receive 1 dose of Tdap. This initial dose should be followed by tetanus and diphtheria toxoids (Td) booster doses every 10 years. Adults with an unknown or incomplete history of completing a 3-dose immunization series with Td-containing vaccines should begin or complete a primary immunization series including a Tdap dose. Adults should receive a Td booster every 10 years.  Varicella vaccine. An adult without evidence of immunity to varicella should receive 2 doses or a second dose if she has previously received 1 dose. Pregnant females who do not have evidence of immunity should receive the first dose after pregnancy. This first dose should be obtained before leaving the health care facility. The second dose should be obtained 4-8 weeks after the first dose.  Human papillomavirus (HPV) vaccine. Females aged 13-26 years who have not received the vaccine previously should obtain the 3-dose series. The vaccine is not recommended for use in pregnant females. However, pregnancy testing is not needed before receiving a dose. If a female is found to be pregnant after receiving a dose, no treatment is needed. In that case, the remaining doses should be delayed until after the pregnancy. Immunization is recommended for any person with an immunocompromised condition through the age of 36 years if she did not get any or all doses earlier. During the 3-dose series, the second dose should be obtained 4-8 weeks after the  first dose. The third dose should be obtained 24 weeks after the first dose and 16 weeks after the second dose.  Zoster vaccine. One dose is recommended for adults aged 19 years or older unless certain conditions are present.  Measles, mumps, and rubella (MMR) vaccine. Adults born before [birth year] generally  are considered immune to measles and mumps. Adults born in [birth year] or later should have 1 or more doses of MMR vaccine unless there is a contraindication to the vaccine or there is laboratory evidence of immunity to each of the three diseases. A routine second dose of MMR vaccine should be obtained at least 28 days after the first dose for students attending postsecondary schools, health care workers, or international travelers. People who received inactivated measles vaccine or an unknown type of measles vaccine during 1963-1967 should receive 2 doses of MMR vaccine. People who received inactivated mumps vaccine or an unknown type of mumps vaccine before 1979 and are at high risk for mumps infection should consider immunization with 2 doses of MMR vaccine. For females of childbearing age, rubella immunity should be determined. If there is no evidence of immunity, females who are not pregnant should be vaccinated. If there is no evidence of immunity, females who are pregnant should delay immunization until after pregnancy. Unvaccinated health care workers born before [birth year] who lack laboratory evidence of measles, mumps, or rubella immunity or laboratory confirmation of disease should consider measles and mumps immunization with 2 doses of MMR vaccine or rubella immunization with 1 dose of MMR vaccine.  Pneumococcal 13-valent conjugate (PCV13) vaccine. When indicated, a person who is uncertain of her immunization history and has no record of immunization should receive the PCV13 vaccine. An adult aged 43 years or older who has certain medical conditions and has not been previously immunized should receive 1 dose of  PCV13 vaccine. This PCV13 should be followed with a dose of pneumococcal polysaccharide (PPSV23) vaccine. The PPSV23 vaccine dose should be obtained at least 8 weeks after the dose of PCV13 vaccine. An adult aged 81 years or older who has certain medical conditions and previously received 1 or more doses of PPSV23 vaccine should receive 1 dose of PCV13. The PCV13 vaccine dose should be obtained 1 or more years after the last PPSV23 vaccine dose.  Pneumococcal polysaccharide (PPSV23) vaccine. When PCV13 is also indicated, PCV13 should be obtained first. All adults aged 21 years and older should be immunized. An adult younger than age 17 years who has certain medical conditions should be immunized. Any person who resides in a nursing home or long-term care facility should be immunized. An adult smoker should be immunized. People with an immunocompromised condition and certain other conditions should receive both PCV13 and PPSV23 vaccines. People with human immunodeficiency virus (HIV) infection should be immunized as soon as possible after diagnosis. Immunization during chemotherapy or radiation therapy should be avoided. Routine use of PPSV23 vaccine is not recommended for American Indians, [place] Natives, or people younger than 65 years unless there are medical conditions that require PPSV23 vaccine. When indicated, people who have unknown immunization and have no record of immunization should receive PPSV23 vaccine. One-time revaccination 5 years after the first dose of PPSV23 is recommended for people aged 19-64 years who have chronic kidney failure, nephrotic syndrome, asplenia, or immunocompromised conditions. People who received 1-2 doses of PPSV23 before age 40 years should receive another dose of PPSV23 vaccine at age 87 years or later if at least 5 years have passed since the previous dose. Doses of PPSV23 are not needed for people immunized with PPSV23 at or after age 39 years.  Meningococcal vaccine.  Adults with asplenia or persistent complement component deficiencies should receive 2 doses of quadrivalent meningococcal conjugate (MenACWY-D) vaccine. The doses should be obtained at least 2 months apart. Microbiologists  working with certain meningococcal bacteria, [place] recruits, people at risk during an outbreak, and people who travel to or live in countries with a high rate of meningitis should be immunized. A first-year college student up through age 39 years who is living in a residence hall should receive a dose if she did not receive a dose on or after her 16th birthday. Adults who have certain high-risk conditions should receive one or more doses of vaccine.  Hepatitis A vaccine. Adults who wish to be protected from this disease, have certain high-risk conditions, work with hepatitis A-infected animals, work in hepatitis A research labs, or travel to or work in countries with a high rate of hepatitis A should be immunized. Adults who were previously unvaccinated and who anticipate close contact with an international adoptee during the first 60 days after arrival in the [place] from a country with a high rate of hepatitis A should be immunized.  Hepatitis B vaccine. Adults who wish to be protected from this disease, have certain high-risk conditions, may be exposed to blood or other infectious body fluids, are household contacts or sex partners of hepatitis B positive people, are clients or workers in certain care facilities, or travel to or work in countries with a high rate of hepatitis B should be immunized.  Haemophilus influenzae type b (Hib) vaccine. A previously unvaccinated person with asplenia or sickle cell disease or having a scheduled splenectomy should receive 1 dose of Hib vaccine. Regardless of previous immunization, a recipient of a hematopoietic stem cell transplant should receive a 3-dose series 6-12 months after her successful transplant. Hib vaccine is not recommended for  adults with HIV infection. Preventive Services / Frequency Ages 33 to 75 years  Blood pressure check.** / Every 1 to 2 years.  Lipid and cholesterol check.** / Every 5 years beginning at age 25.  Clinical breast exam.** / Every 3 years for women in their 35s and 63s.  BRCA-related cancer risk assessment.** / For women who have family members with a BRCA-related cancer (breast, ovarian, tubal, or peritoneal cancers).  Pap test.** / Every 2 years from ages 49 through 14. Every 3 years starting at age 61 through age 75 or 7 with a history of 3 consecutive normal Pap tests.  HPV screening.** / Every 3 years from ages 46 through ages 74 to 19 with a history of 3 consecutive normal Pap tests.  Hepatitis C blood test.** / For any individual with known risks for hepatitis C.  Skin self-exam. / Monthly.  Influenza vaccine. / Every year.  Tetanus, diphtheria, and acellular pertussis (Tdap, Td) vaccine.** / Consult your health care provider. Pregnant women should receive 1 dose of Tdap vaccine during each pregnancy. 1 dose of Td every 10 years.  Varicella vaccine.** / Consult your health care provider. Pregnant females who do not have evidence of immunity should receive the first dose after pregnancy.  HPV vaccine. / 3 doses over 6 months, if 18 and younger. The vaccine is not recommended for use in pregnant females. However, pregnancy testing is not needed before receiving a dose.  Measles, mumps, rubella (MMR) vaccine.** / You need at least 1 dose of MMR if you were born in 1957 or later. You may also need a 2nd dose. For females of childbearing age, rubella immunity should be determined. If there is no evidence of immunity, females who are not pregnant should be vaccinated. If there is no evidence of immunity, females who are pregnant should delay immunization  until after pregnancy.  Pneumococcal 13-valent conjugate (PCV13) vaccine.** / Consult your health care provider.  Pneumococcal  polysaccharide (PPSV23) vaccine.** / 1 to 2 doses if you smoke cigarettes or if you have certain conditions.  Meningococcal vaccine.** / 1 dose if you are age 4 to 75 years and a Market researcher living in a residence hall, or have one of several medical conditions, you need to get vaccinated against meningococcal disease. You may also need additional booster doses.  Hepatitis A vaccine.** / Consult your health care provider.  Hepatitis B vaccine.** / Consult your health care provider.  Haemophilus influenzae type b (Hib) vaccine.** / Consult your health care provider. Ages 38 to 46 years  Blood pressure check.** / Every 1 to 2 years.  Lipid and cholesterol check.** / Every 5 years beginning at age 31 years.  Lung cancer screening. / Every year if you are aged 63-80 years and have a 30-pack-year history of smoking and currently smoke or have quit within the past 15 years. Yearly screening is stopped once you have quit smoking for at least 15 years or develop a health problem that would prevent you from having lung cancer treatment.  Clinical breast exam.** / Every year after age 21 years.  BRCA-related cancer risk assessment.** / For women who have family members with a BRCA-related cancer (breast, ovarian, tubal, or peritoneal cancers).  Mammogram.** / Every year beginning at age 9 years and continuing for as long as you are in good health. Consult with your health care provider.  Pap test.** / Every 3 years starting at age 36 years through age 19 or 5 years with a history of 3 consecutive normal Pap tests.  HPV screening.** / Every 3 years from ages 41 years through ages 38 to 14 years with a history of 3 consecutive normal Pap tests.  Fecal occult blood test (FOBT) of stool. / Every year beginning at age 33 years and continuing until age 68 years. You may not need to do this test if you get a colonoscopy every 10 years.  Flexible sigmoidoscopy or colonoscopy.** / Every 5  years for a flexible sigmoidoscopy or every 10 years for a colonoscopy beginning at age 17 years and continuing until age 74 years.  Hepatitis C blood test.** / For all people born from [birth year] through 1963 and any individual with known risks for hepatitis C.  Skin self-exam. / Monthly.  Influenza vaccine. / Every year.  Tetanus, diphtheria, and acellular pertussis (Tdap/Td) vaccine.** / Consult your health care provider. Pregnant women should receive 1 dose of Tdap vaccine during each pregnancy. 1 dose of Td every 10 years.  Varicella vaccine.** / Consult your health care provider. Pregnant females who do not have evidence of immunity should receive the first dose after pregnancy.  Zoster vaccine.** / 1 dose for adults aged 72 years or older.  Measles, mumps, rubella (MMR) vaccine.** / You need at least 1 dose of MMR if you were born in 1957 or later. You may also need a 2nd dose. For females of childbearing age, rubella immunity should be determined. If there is no evidence of immunity, females who are not pregnant should be vaccinated. If there is no evidence of immunity, females who are pregnant should delay immunization until after pregnancy.  Pneumococcal 13-valent conjugate (PCV13) vaccine.** / Consult your health care provider.  Pneumococcal polysaccharide (PPSV23) vaccine.** / 1 to 2 doses if you smoke cigarettes or if you have certain conditions.  Meningococcal vaccine.** / Consult  your health care provider.  Hepatitis A vaccine.** / Consult your health care provider.  Hepatitis B vaccine.** / Consult your health care provider.  Haemophilus influenzae type b (Hib) vaccine.** / Consult your health care provider. Ages 12 years and over  Blood pressure check.** / Every 1 to 2 years.  Lipid and cholesterol check.** / Every 5 years beginning at age 33 years.  Lung cancer screening. / Every year if you are aged 39-80 years and have a 30-pack-year history of smoking and currently  smoke or have quit within the past 15 years. Yearly screening is stopped once you have quit smoking for at least 15 years or develop a health problem that would prevent you from having lung cancer treatment.  Clinical breast exam.** / Every year after age 48 years.  BRCA-related cancer risk assessment.** / For women who have family members with a BRCA-related cancer (breast, ovarian, tubal, or peritoneal cancers).  Mammogram.** / Every year beginning at age 69 years and continuing for as long as you are in good health. Consult with your health care provider.  Pap test.** / Every 3 years starting at age 53 years through age 51 or 60 years with 3 consecutive normal Pap tests. Testing can be stopped between 65 and 70 years with 3 consecutive normal Pap tests and no abnormal Pap or HPV tests in the past 10 years.  HPV screening.** / Every 3 years from ages 56 years through ages 5 or 55 years with a history of 3 consecutive normal Pap tests. Testing can be stopped between 65 and 70 years with 3 consecutive normal Pap tests and no abnormal Pap or HPV tests in the past 10 years.  Fecal occult blood test (FOBT) of stool. / Every year beginning at age 55 years and continuing until age 69 years. You may not need to do this test if you get a colonoscopy every 10 years.  Flexible sigmoidoscopy or colonoscopy.** / Every 5 years for a flexible sigmoidoscopy or every 10 years for a colonoscopy beginning at age 79 years and continuing until age 6 years.  Hepatitis C blood test.** / For all people born from [birth year] through 1963 and any individual with known risks for hepatitis C.  Osteoporosis screening.** / A one-time screening for women ages 34 years and over and women at risk for fractures or osteoporosis.  Skin self-exam. / Monthly.  Influenza vaccine. / Every year.  Tetanus, diphtheria, and acellular pertussis (Tdap/Td) vaccine.** / 1 dose of Td every 10 years.  Varicella vaccine.** / Consult your  health care provider.  Zoster vaccine.** / 1 dose for adults aged 62 years or older.  Pneumococcal 13-valent conjugate (PCV13) vaccine.** / Consult your health care provider.  Pneumococcal polysaccharide (PPSV23) vaccine.** / 1 dose for all adults aged 52 years and older.  Meningococcal vaccine.** / Consult your health care provider.  Hepatitis A vaccine.** / Consult your health care provider.  Hepatitis B vaccine.** / Consult your health care provider.  Haemophilus influenzae type b (Hib) vaccine.** / Consult your health care provider. ** Family history and personal history of risk and conditions may change your health care provider's recommendations. Document Released: 01/05/2002 Document Revised: 03/26/2014 Document Reviewed: 04/06/2011 [facility] Patient Information 2015 [place], [place]. This information is not intended to replace advice given to you by your health care provider. Make sure you discuss any questions you have with your health care provider.

## 2014-10-31 ENCOUNTER — Ambulatory Visit (INDEPENDENT_AMBULATORY_CARE_PROVIDER_SITE_OTHER): Payer: Medicare Other | Admitting: Ophthalmology

## 2014-10-31 LAB — CBC
HCT: 42.3 % (ref 36.0–46.0)
Hemoglobin: 13.4 g/dL (ref 12.0–15.0)
MCHC: 31.7 g/dL (ref 30.0–36.0)
MCV: 79 fl (ref 78.0–100.0)
Platelets: 348 10*3/uL (ref 150.0–400.0)
RBC: 5.35 Mil/uL — ABNORMAL HIGH (ref 3.87–5.11)
RDW: 14.7 % (ref 11.5–15.5)
WBC: 9.8 10*3/uL (ref 4.0–10.5)

## 2014-10-31 LAB — RENAL FUNCTION PANEL
Albumin: 4.2 g/dL (ref 3.5–5.2)
BUN: 27 mg/dL — ABNORMAL HIGH (ref 6–23)
CALCIUM: 9.8 mg/dL (ref 8.4–10.5)
CHLORIDE: 99 meq/L (ref 96–112)
CO2: 25 mEq/L (ref 19–32)
Creatinine, Ser: 1 mg/dL (ref 0.4–1.2)
GFR: 61.71 mL/min (ref >60.00)
GLUCOSE: 80 mg/dL (ref 70–99)
PHOSPHORUS: 4.1 mg/dL (ref 2.3–4.6)
POTASSIUM: 4.9 meq/L (ref 3.5–5.1)
SODIUM: 135 meq/L (ref 135–145)

## 2014-10-31 LAB — HEPATIC FUNCTION PANEL
ALBUMIN: 4.2 g/dL (ref 3.5–5.2)
ALT: 34 U/L (ref 0–35)
AST: 22 U/L (ref 0–37)
Alkaline Phosphatase: 45 U/L (ref 39–117)
Bilirubin, Direct: 0 mg/dL (ref 0.0–0.3)
TOTAL PROTEIN: 7.2 g/dL (ref 6.0–8.3)
Total Bilirubin: 0.6 mg/dL (ref 0.2–1.2)

## 2014-10-31 LAB — LIPID PANEL
CHOL/HDL RATIO: 5
CHOLESTEROL: 190 mg/dL (ref 0–200)
HDL: 37.6 mg/dL — AB (ref >39.00)
NONHDL: 152.4
TRIGLYCERIDES: 314 mg/dL — AB (ref 0.0–149.0)
VLDL: 62.8 mg/dL — ABNORMAL HIGH (ref 0.0–40.0)

## 2014-10-31 LAB — LDL CHOLESTEROL, DIRECT: LDL DIRECT: 121.8 mg/dL

## 2014-10-31 LAB — HEMOGLOBIN A1C: Hgb A1c MFr Bld: 7.5 % — ABNORMAL HIGH (ref 4.6–6.5)

## 2014-10-31 LAB — TSH: TSH: 2.11 u[IU]/mL (ref 0.35–4.50)

## 2014-11-02 ENCOUNTER — Other Ambulatory Visit: Payer: Self-pay | Admitting: Endocrinology

## 2014-11-02 ENCOUNTER — Telehealth: Payer: Self-pay | Admitting: Family Medicine

## 2014-11-02 NOTE — Telephone Encounter (Signed)
Pt inquiring about lab results

## 2014-11-02 NOTE — Telephone Encounter (Signed)
Called and informed patient of lab results and Dr. Elby ShowersBlyths' recommendations. I also informed patient that a copy of her lab results were mailed to her. No further questions/concerns. JG//CMA

## 2014-11-05 ENCOUNTER — Encounter: Payer: Self-pay | Admitting: Family Medicine

## 2014-11-05 DIAGNOSIS — F419 Anxiety disorder, unspecified: Secondary | ICD-10-CM | POA: Insufficient documentation

## 2014-11-05 DIAGNOSIS — F32A Depression, unspecified: Secondary | ICD-10-CM

## 2014-11-05 DIAGNOSIS — F329 Major depressive disorder, single episode, unspecified: Secondary | ICD-10-CM | POA: Insufficient documentation

## 2014-11-05 DIAGNOSIS — R1032 Left lower quadrant pain: Secondary | ICD-10-CM

## 2014-11-05 HISTORY — DX: Left lower quadrant pain: R10.32

## 2014-11-05 HISTORY — DX: Depression, unspecified: F32.A

## 2014-11-05 HISTORY — DX: Anxiety disorder, unspecified: F41.9

## 2014-11-05 NOTE — Assessment & Plan Note (Signed)
Tolerating statin, encouraged heart healthy diet, avoid trans fats, minimize simple carbs and saturated fats. Increase exercise as tolerated 

## 2014-11-05 NOTE — Assessment & Plan Note (Signed)
Patient denies any difficulties at home. No trouble with ADLs, depression or falls. No recent changes to vision or hearing. Is UTD with immunizations. Is UTD with screening. Discussed Advanced Directives, patient agrees to bring us copies of documents if can. Encouraged heart healthy diet, exercise as tolerated and adequate sleep. Follows with Dr Danella DeisGruber of derm. Has not proceed with colonoscopy as planned due to spending a great deal of time in [place] due to her mother's illness and recent death. IFOB was neg and she will proceed when she returns in spring. MGM and pap done in March 2015. Repeat in 2-3 years

## 2014-11-05 NOTE — Assessment & Plan Note (Signed)
On Levothyroxine, continue to monitor 

## 2014-11-05 NOTE — Assessment & Plan Note (Signed)
Recently lost her mother and her dog. She declines meds but acknowledges anhedonia and lability. Allowed a refill on Lorazepam for prn use.

## 2014-11-05 NOTE — Assessment & Plan Note (Signed)
Well controlled, no changes to meds. Encouraged heart healthy diet such as the DASH diet and exercise as tolerated.  °

## 2014-11-05 NOTE — Progress Notes (Signed)
Crystal PuckerJanie M Hodge  [id number] [birth year] 11/05/2014      Progress Note-Follow Up  Subjective  Chief Complaint  Chief Complaint  Patient presents with  . Annual Exam    medicare wellness    HPI  Patient is a 68 y.o. female in today for routine medical care. Patient is here today with numerous complaints. She [place] mother's illness progressed and recently her mother has passed away. She is also struggling with the loss of her long-term patch. Her dog died at an elderly age but she is feeling very sad and lonely. Denies suicidal ideation but is struggling with anhedonia and anxiety. She has been wearing compression hose due to some pain and swelling in her legs with driving. Describes a throbbing in her thigh after long sitting none at the present time. Has been having some intermittent left lower quadrant pain infrequently. She describes it is bleeding but stabbing. No panel related to eating or defecation. She does note the size and caliber of her stools recently changed but that is more recent. The pain has been present intermittently and infrequently less than once a week for several months and the change in stool has been related to her mother's passing. Her stool is looser the past couple of weeks. No bloody or tarry stool. No GU complaints. Denies chest pain, palpitations or shortness of breath. Past Medical History  Diagnosis Date  . Diabetes mellitus     type II  . Hypertension   . Hyperlipidemia   . Thyroid disease   . Hypothyroidism   . Rectocele   . Menopause   . Obesity   . Tremor 04/07/2011  . Staph infection 09/12/2013  . Rectocele 09/12/2013  . Preventative health care 09/16/2013  . Acute upper respiratory infections of unspecified site 09/16/2013  . Dyspnea 11/19/2013  . Cervical cancer screening 01/30/2014  . Sun-damaged skin 02/04/2014  . Dermatitis 06/01/2014  . Medicare annual wellness visit, subsequent  09/16/2013    Has seen Dr Danella DeisGruber of dermatology in past but presently struggling with insurance concerns may need referral to new derm, no concerns at present.   . Anxiety and depression 11/05/2014  . Abdominal pain, left lower quadrant 11/05/2014    Past Surgical History  Procedure Laterality Date  . Abdominal hysterectomy  07/28/95  . Carpal tunnel release  10/07    Right    Family History  Problem Relation Age of Onset  . Hyperlipidemia Mother   . Mental illness Mother   . Dementia Mother   . Hyperlipidemia Father   . Heart disease Father     triple bypass  . Diabetes Son     ?type I  . Mental illness Son     bipolar  . Depression Maternal Aunt   . Stroke Maternal Grandmother   . Mental illness Son   . Mental illness Brother     bipolar    History   Social History  . Marital Status: Widowed    Spouse Name: N/A    Number of Children: 2  . Years of Education: HS   Occupational History  . Retired    Social History Main Topics  . Smoking status: Former Smoker -- 0.50 packs/day for 30 years    Types: Cigarettes    Quit date: 11/23/1990  . Smokeless tobacco: Never Used  . Alcohol Use: Yes     Comment: occasional  . Drug Use: No  . Sexual  Activity:    Partners: Male   Other Topics Concern  . Not on file   Social History Narrative   Caffeine: 1 mug coffee daily   Regular exercise: plays tennis.          Current Outpatient Prescriptions on File Prior to Visit  Medication Sig Dispense Refill  . aspirin 81 MG tablet Take 81 mg by mouth every morning.      Marland Kitchen atorvastatin (LIPITOR) 20 MG tablet Take 1 tablet (20 mg total) by mouth every evening. 90 tablet 1  . benazepril (LOTENSIN) 10 MG tablet Take 1 tablet (10 mg total) by mouth daily. 90 tablet 1  . Coenzyme Q10 (CO Q 10) 10 MG CAPS Take 100 mg by mouth every morning.      . docusate sodium (COLACE) 100 MG capsule Take 100 mg by mouth 2 (two) times daily.      . Fiber CAPS Take 1 capsule by mouth every  morning.      Marland Kitchen glimepiride (AMARYL) 1 MG tablet Take 1 tablet (1 mg total) by mouth daily before supper. 90 tablet 1  . levothyroxine (SYNTHROID, LEVOTHROID) 100 MCG tablet Take 1 tablet (100 mcg total) by mouth daily. 90 tablet 1  . NON FORMULARY 1 tsp honey with Manuka every morning.     . triamterene-hydrochlorothiazide (DYAZIDE) 37.5-25 MG per capsule Take 1 each (1 capsule total) by mouth daily. 90 capsule 1  . glucose blood (FREESTYLE LITE) test strip Use as instructed to check blood sugars 2 times per day dx code 250.00 (Patient not taking: Reported on 10/30/2014) 100 each 12  . valACYclovir (VALTREX) 1000 MG tablet      No current facility-administered medications on file prior to visit.    Allergies  Allergen Reactions  . Amoxicillin Diarrhea  . Prednisone     Hallucinations- can't take oral    Review of Systems  Review of Systems  Constitutional: Positive for malaise/fatigue. Negative for fever.  HENT: Negative for congestion.   Eyes: Negative for discharge.  Respiratory: Negative for shortness of breath.   Cardiovascular: Negative for chest pain, palpitations and leg swelling.  Gastrointestinal: Positive for abdominal pain and diarrhea. Negative for nausea, vomiting, constipation, blood in stool and melena.  Genitourinary: Negative for dysuria.  Musculoskeletal: Positive for myalgias and back pain. Negative for falls.  Skin: Negative for rash.  Neurological: Negative for loss of consciousness and headaches.  Endo/Heme/Allergies: Negative for polydipsia.  Psychiatric/Behavioral: Positive for depression. Negative for suicidal ideas. The patient is nervous/anxious. The patient does not have insomnia.     Objective  BP 117/70 mmHg  Pulse 71  Temp(Src) 98.1 F (36.7 C) (Oral)  Ht 5\' 10"  (1.778 m)  Wt 232 lb 12.8 oz (105.597 kg)  BMI 33.40 kg/m2  SpO2 97%  Physical Exam  Physical Exam  Constitutional: She is oriented to person, place, and time and well-developed,  well-nourished, and in no distress. No distress.  HENT:  Head: Normocephalic and atraumatic.  Right Ear: External ear normal.  Left Ear: External ear normal.  Nose: Nose normal.  Mouth/Throat: Oropharynx is clear and moist. No oropharyngeal exudate.  Eyes: Conjunctivae are normal. Pupils are equal, round, and reactive to light. Right eye exhibits no discharge. Left eye exhibits no discharge. No scleral icterus.  Neck: Normal range of motion. Neck supple. No thyromegaly present.  Cardiovascular: Normal rate, regular rhythm, normal heart sounds and intact distal pulses.   No murmur heard. Pulmonary/Chest: Effort normal and breath sounds normal. No  respiratory distress. She has no wheezes. She has no rales.  Abdominal: Soft. Bowel sounds are normal. She exhibits no distension and no mass. There is no tenderness.  Musculoskeletal: Normal range of motion. She exhibits no edema or tenderness.  Lymphadenopathy:    She has no cervical adenopathy.  Neurological: She is alert and oriented to person, place, and time. She has normal reflexes. No cranial nerve deficit. Coordination normal.  Mild tremor in hands.  Skin: Skin is warm and dry. No rash noted. She is not diaphoretic.  Psychiatric: Mood, memory and affect normal.    Lab Results  Component Value Date   TSH 2.11 10/30/2014   Lab Results  Component Value Date   WBC 9.8 10/30/2014   HGB 13.4 10/30/2014   HCT 42.3 10/30/2014   MCV 79.0 10/30/2014   PLT 348.0 10/30/2014   Lab Results  Component Value Date   CREATININE 1.0 10/30/2014   BUN 27* 10/30/2014   NA 135 10/30/2014   K 4.9 10/30/2014   CL 99 10/30/2014   CO2 25 10/30/2014   Lab Results  Component Value Date   ALT 34 10/30/2014   AST 22 10/30/2014   ALKPHOS 45 10/30/2014   BILITOT 0.6 10/30/2014   Lab Results  Component Value Date   CHOL 190 10/30/2014   Lab Results  Component Value Date   HDL 37.60* 10/30/2014   Lab Results  Component Value Date   LDLCALC  105* 05/01/2014   Lab Results  Component Value Date   TRIG 314.0* 10/30/2014   Lab Results  Component Value Date   CHOLHDL 5 10/30/2014     Assessment & Plan  HTN (hypertension) Well controlled, no changes to meds. Encouraged heart healthy diet such as the DASH diet and exercise as tolerated.   Hypothyroidism On Levothyroxine, continue to monitor  Hyperlipidemia Tolerating statin, encouraged heart healthy diet, avoid trans fats, minimize simple carbs and saturated fats. Increase exercise as tolerated  Medicare annual wellness visit, subsequent Patient denies any difficulties at home. No trouble with ADLs, depression or falls. No recent changes to vision or hearing. Is UTD with immunizations. Is UTD with screening. Discussed Advanced Directives, patient agrees to bring us copies of documents if can. Encouraged heart healthy diet, exercise as tolerated and adequate sleep. Follows with Dr Danella DeisGruber of derm. Has not proceed with colonoscopy as planned due to spending a great deal of time in [place] due to her mother's illness and recent death. IFOB was neg and she will proceed when she returns in spring. MGM and pap done in March 2015. Repeat in 2-3 years  Diabetes mellitus type 2 in obese [id number] unacceptable, minimize simple carbs. Increase exercise as tolerated. Continue current meds, will follow up with Endocrine soon and acknowledges with her stress with her mother's passing she has not been eating well. Encouraged to redouble efforts.   Anxiety and depression Recently lost her mother and her dog. She declines meds but acknowledges anhedonia and lability. Allowed a refill on Lorazepam for prn use.   Abdominal pain, left lower quadrant Has intermittent episodes that pass quickly but are sharp when they occur. No pattern relating to eating or defecation she can identify. Encouraged colonoscopy. Encouraged increased hydration and fiber in diet. Daily probiotics. Seek care if worsens. Has had  some loose stool with increased stress recently but appears to be improving.

## 2014-11-05 NOTE — Assessment & Plan Note (Signed)
hgba1c unacceptable, minimize simple carbs. Increase exercise as tolerated. Continue current meds, will follow up with Endocrine soon and acknowledges with her stress with her mother's passing she has not been eating well. Encouraged to redouble efforts.

## 2014-11-05 NOTE — Assessment & Plan Note (Signed)
Has intermittent episodes that pass quickly but are sharp when they occur. No pattern relating to eating or defecation she can identify. Encouraged colonoscopy. Encouraged increased hydration and fiber in diet. Daily probiotics. Seek care if worsens. Has had some loose stool with increased stress recently but appears to be improving.

## 2014-11-09 ENCOUNTER — Ambulatory Visit (INDEPENDENT_AMBULATORY_CARE_PROVIDER_SITE_OTHER): Payer: Medicare Other | Admitting: Ophthalmology

## 2014-11-09 DIAGNOSIS — H35033 Hypertensive retinopathy, bilateral: Secondary | ICD-10-CM

## 2014-11-09 DIAGNOSIS — I10 Essential (primary) hypertension: Secondary | ICD-10-CM

## 2014-11-09 DIAGNOSIS — H43813 Vitreous degeneration, bilateral: Secondary | ICD-10-CM

## 2014-11-09 DIAGNOSIS — H35371 Puckering of macula, right eye: Secondary | ICD-10-CM

## 2014-11-09 DIAGNOSIS — E11329 Type 2 diabetes mellitus with mild nonproliferative diabetic retinopathy without macular edema: Secondary | ICD-10-CM

## 2014-11-09 DIAGNOSIS — E11319 Type 2 diabetes mellitus with unspecified diabetic retinopathy without macular edema: Secondary | ICD-10-CM

## 2014-11-22 ENCOUNTER — Ambulatory Visit (INDEPENDENT_AMBULATORY_CARE_PROVIDER_SITE_OTHER): Payer: Medicare Other | Admitting: Endocrinology

## 2014-11-22 ENCOUNTER — Encounter: Payer: Self-pay | Admitting: Endocrinology

## 2014-11-22 VITALS — BP 138/78 | HR 90 | Temp 99.1°F | Resp 16 | Ht 70.0 in

## 2014-11-22 DIAGNOSIS — F32A Depression, unspecified: Secondary | ICD-10-CM

## 2014-11-22 DIAGNOSIS — E119 Type 2 diabetes mellitus without complications: Secondary | ICD-10-CM

## 2014-11-22 DIAGNOSIS — F329 Major depressive disorder, single episode, unspecified: Secondary | ICD-10-CM

## 2014-11-22 DIAGNOSIS — E785 Hyperlipidemia, unspecified: Secondary | ICD-10-CM

## 2014-11-22 DIAGNOSIS — I10 Essential (primary) hypertension: Secondary | ICD-10-CM

## 2014-11-22 MED ORDER — ATORVASTATIN CALCIUM 40 MG PO TABS: 40.0000 mg | ORAL_TABLET | Freq: Every evening | ORAL | Status: DC

## 2014-11-22 MED ORDER — TRIAMTERENE-HCTZ 37.5-25 MG PO TABS: 0.5000 | ORAL_TABLET | Freq: Every day | ORAL | Status: DC

## 2014-11-22 NOTE — Progress Notes (Signed)
Patient ID: Crystal Hodge, female   DOB: 10/19/1946, 68 y.o.   MRN: [id number]   Reason for Appointment: Diabetes follow-up   History of Present Illness    Diagnosis: Type 2 diabetes mellitus, date of diagnosis: 2008.   She has been on a regimen of Amaryl and metformin; was on Janumet but did not continue because of the cost especially in the donut hole Her A1c has been consistently just over 7% and now relatively higher She has difficulty losing weight although she thinks recently she is losing some weight She refuses to have her weight checked in the office She thinks she has had more stress lately and at times will be eating from stress She has been somewhat irregular with her exercise because of various personal factors  She takes Amaryl in the evening to avoid hypoglycemia midday She also has been inconsistent again on monitoring her blood sugar and has only a few fasting readings in the last few days  Monitors blood glucose: Less than once a day and recent readings are ranging between 109-139 with some readings in the mornings and a couple of readings at bedtime  Glucometer: Freestyle.  Hypoglycemia: None recently  Meals: Eats every 4 hrs, will have eggs or other protein with her breakfast   Physical activity: exercise: At the gym periodically;  walking.  Less consistent recently because of personal issues  Dietician visit: Most recent:5/11.   Complications: peripheral neuropathy, ? retinopathy.   Lab Results  Component Value Date   HGBA1C 7.5* 10/30/2014   HGBA1C 7.3* 07/31/2014   HGBA1C 7.3* 05/01/2014   Lab Results  Component Value Date   MICROALBUR 0.7 07/31/2014   LDLCALC 105* 05/01/2014   CREATININE 1.0 10/30/2014      HYPERLIPIDEMIA:         The lipid abnormality consists of elevated LDL and low HDL. She has been compliant with her Lipitor but her LDL is still relatively high, not clear why it is increasing She may be getting some tachyphylaxis  to her Lipitor 20 mg Nonfasting triglycerides are high.  This is despite her reporting some weight loss  Lab Results  Component Value Date   CHOL 190 10/30/2014   HDL 37.60* 10/30/2014   LDLCALC 105* 05/01/2014   LDLDIRECT 121.8 10/30/2014   TRIG 314.0* 10/30/2014   CHOLHDL 5 10/30/2014    HYPOTHYROIDISM: She is compliant with her thyroid supplement and has no unusual fatigue.  TSH is usually normal with 100 mcg levothyroxine  Lab Results  Component Value Date   TSH 2.11 10/30/2014      No visits with results within 1 Week(s) from this visit. Latest known visit with results is:  Office Visit on 10/30/2014  Component Date Value Ref Range Status  . WBC 10/30/2014 9.8  4.0 - 10.5 K/uL Final  . RBC 10/30/2014 5.35* 3.87 - 5.11 Mil/uL Final  . Platelets 10/30/2014 348.0  150.0 - 400.0 K/uL Final  . Hemoglobin 10/30/2014 13.4  12.0 - 15.0 g/dL Final  . HCT 40/98/1191 42.3  36.0 - 46.0 % Final  . MCV 10/30/2014 79.0  78.0 - 100.0 fl Final  . MCHC 10/30/2014 31.7  30.0 - 36.0 g/dL Final  . RDW 47/82/9562 14.7  11.5 - 15.5 % Final  . TSH 10/30/2014 2.11  0.35 - 4.50 uIU/mL Final  . Sodium 10/30/2014 135  135 - 145 mEq/L Final  . Potassium 10/30/2014 4.9  3.5 - 5.1  mEq/L Final  . Chloride 10/30/2014 99  96 - 112 mEq/L Final  . CO2 10/30/2014 25  19 - 32 mEq/L Final  . Calcium 10/30/2014 9.8  8.4 - 10.5 mg/dL Final  . Albumin 16/10/960412/06/2014 4.2  3.5 - 5.2 g/dL Final  . BUN 54/09/811912/06/2014 27* 6 - 23 mg/dL Final  . Creatinine, Ser 10/30/2014 1.0  0.4 - 1.2 mg/dL Final  . Glucose, Bld 14/78/295612/06/2014 80  70 - 99 mg/dL Final  . Phosphorus 21/30/865712/06/2014 4.1  2.3 - 4.6 mg/dL Final  . GFR 84/69/629512/06/2014 61.71  >60.00 mL/min Final  . Cholesterol 10/30/2014 190  0 - 200 mg/dL Final   ATP III Classification       Desirable:  < 200 mg/dL               Borderline High:  200 - 239 mg/dL          High:  > = 284240 mg/dL  . Triglycerides 10/30/2014 314.0* 0.0 - 149.0 mg/dL Final   Normal:  <132<150 mg/dLBorderline  High:  150 - 199 mg/dL  . HDL 10/30/2014 37.60* >39.00 mg/dL Final  . VLDL 44/01/027212/06/2014 62.8* 0.0 - 40.0 mg/dL Final  . Total CHOL/HDL Ratio 10/30/2014 5   Final                  Men          Women1/2 Average Risk     3.4          3.3Average Risk          5.0          4.42X Average Risk          9.6          7.13X Average Risk          15.0          11.0                      . NonHDL 10/30/2014 152.40   Final   NOTE:  Non-HDL goal should be 30 mg/dL higher than patient's LDL goal (i.e. LDL goal of < 70 mg/dL, would have non-HDL goal of < 100 mg/dL)  . Total Bilirubin 10/30/2014 0.6  0.2 - 1.2 mg/dL Final  . Bilirubin, Direct 10/30/2014 0.0  0.0 - 0.3 mg/dL Final  . Alkaline Phosphatase 10/30/2014 45  39 - 117 U/L Final  . AST 10/30/2014 22  0 - 37 U/L Final  . ALT 10/30/2014 34  0 - 35 U/L Final  . Total Protein 10/30/2014 7.2  6.0 - 8.3 g/dL Final  . Albumin 53/66/440312/06/2014 4.2  3.5 - 5.2 g/dL Final  . Hgb K7QA1c MFr Bld 10/30/2014 7.5* 4.6 - 6.5 % Final   Glycemic Control Guidelines for People with Diabetes:Non Diabetic:  <6%Goal of Therapy: <7%Additional Action Suggested:  >8%   . Direct LDL 10/30/2014 121.8   Final   Optimal:  <100 mg/dLNear or Above Optimal:  100-129 mg/dLBorderline High:  130-159 mg/dLHigh:  160-189 mg/dLVery High:  >190 mg/dL      Medication List       This list is accurate as of: 11/22/14 12:12 PM.  Always use your most recent med list.               aspirin 81 MG tablet  Take 81 mg by mouth every morning.     atorvastatin 20 MG tablet  Commonly known as:  LIPITOR  Take  1 tablet (20 mg total) by mouth every evening.     benazepril 10 MG tablet  Commonly known as:  LOTENSIN  Take 1 tablet (10 mg total) by mouth daily.     Co Q 10 10 MG Caps  Take 100 mg by mouth every morning.     docusate sodium 100 MG capsule  Commonly known as:  COLACE  Take 100 mg by mouth 2 (two) times daily.     Fiber Caps  Take 1 capsule by mouth every morning.     glimepiride 1  MG tablet  Commonly known as:  AMARYL  Take 1 tablet (1 mg total) by mouth daily before supper.     glucose blood test strip  Commonly known as:  FREESTYLE LITE  Use as instructed to check blood sugars 2 times per day dx code 250.00     levothyroxine 100 MCG tablet  Commonly known as:  SYNTHROID, LEVOTHROID  Take 1 tablet (100 mcg total) by mouth daily.     LORazepam 0.5 MG tablet  Commonly known as:  ATIVAN  Take 1 tablet (0.5 mg total) by mouth 2 (two) times daily as needed for anxiety or sleep.     metFORMIN 1000 MG tablet  Commonly known as:  GLUCOPHAGE  TAKE ONE TABLET BY MOUTH TWICE DAILY     NON FORMULARY  1 tsp honey with Manuka every morning.     triamterene-hydrochlorothiazide 37.5-25 MG per capsule  Commonly known as:  DYAZIDE  Take 1 each (1 capsule total) by mouth daily.     valACYclovir 1000 MG tablet  Commonly known as:  VALTREX        Allergies:  Allergies  Allergen Reactions  . Amoxicillin Diarrhea  . Prednisone     Hallucinations- can't take oral    Past Medical History  Diagnosis Date  . Diabetes mellitus     type II  . Hypertension   . Hyperlipidemia   . Thyroid disease   . Hypothyroidism   . Rectocele   . Menopause   . Obesity   . Tremor 04/07/2011  . Staph infection 09/12/2013  . Rectocele 09/12/2013  . Preventative health care 09/16/2013  . Acute upper respiratory infections of unspecified site 09/16/2013  . Dyspnea 11/19/2013  . Cervical cancer screening 01/30/2014  . Sun-damaged skin 02/04/2014  . Dermatitis 06/01/2014  . Medicare annual wellness visit, subsequent 09/16/2013    Has seen Dr Danella DeisGruber of dermatology in past but presently struggling with insurance concerns may need referral to new derm, no concerns at present.   . Anxiety and depression 11/05/2014  . Abdominal pain, left lower quadrant 11/05/2014    Past Surgical History  Procedure Laterality Date  . Abdominal hysterectomy  07/28/95  . Carpal tunnel release  10/07     Right    Family History  Problem Relation Age of Onset  . Hyperlipidemia Mother   . Mental illness Mother   . Dementia Mother   . Hyperlipidemia Father   . Heart disease Father     triple bypass  . Diabetes Son     ?type I  . Mental illness Son     bipolar  . Depression Maternal Aunt   . Stroke Maternal Grandmother   . Mental illness Son   . Mental illness Brother     bipolar    Social History:  reports that she quit smoking about 24 years ago. Her smoking use included Cigarettes. She has a 15 pack-year smoking history.  She has never used smokeless tobacco. She reports that she drinks alcohol. She reports that she does not use illicit drugs.  Review of Systems   She complains of depressed mood and difficulty dealing with stress, some difficulty sleeping Currently taking only Ativan as needed for anxiety She is grieving at the present time; also her husband had died 4 years ago in Nov 21, 2023 She is somewhat tearful in the office today discussing her stress  She has long-standing hypothyroidism, has no unusual fatigue and is compliant with her medication Currently on 100 g levothyroxine TSH is usually normal   Lab Results  Component Value Date   TSH 2.11 10/30/2014      HYPERTENSION:  blood pressure is well controlled, currently using Lotensin 10 mg and Dyazide She was told to take only half tablet of Maxzide previously to avoid electrolyte imbalance but she got a prescription for Dyazide more recently and is taking   No history of numbness or tingling/burning in her feet, diabetic Foot exam done in 11/14    Examination:   BP 138/78 mmHg  Pulse 90  Temp(Src) 99.1 F (37.3 C)  Resp 16  Ht 5\' 10"  (1.778 m)  Wt   SpO2 97%  Body mass index is 0.00 kg/(m^2).    no edema  Assesment/PLAN:  1. Diabetes type 2, uncontrolled   The patient's diabetes control appears to be about the same and A1c is still over 7% Since she does not check blood sugars much at home not  clear if she can have post prandial hyperglycemia that is not documented Usually has good blood sugars on her monitor with a couple of good readings at night; fasting readings are relatively higher although excellent in the lab She is still only on low dose Amaryl without hypoglycemia Most likely has some postprandial hyperglycemia but is not motivated to monitor her glucose She thinks she can be more consistent with diet with reduction of stress She can also do better with her exercise when she goes to [place] soon Also since she is tolerating the regular metformin maximum dose will continue this Followup in 3 months with A1c again  2. Hypothyroidism: Adequately replaced on 100 g and will continue  3. Hypertension: Currently controlled  with Lotensin and Dyazide 25   4.  History of hypokalemia: Currently potassium is high normal and will have her go back to half tablet of Maxzide only  5.  Situational depression: She should discuss an antidepressant with her PCP, discussed that she can take this short-term without any difficulties to get over her grieving process  6.  HYPERLIPIDEMIA: Not adequately controlled and will increase her Lipitor to 40 mg She can overall try to reduce saturated fat especially cheese  Counseling time over 50% of today's 25 minute visit    Crystal Hodge 11/22/2014, 12:12 PM

## 2014-11-22 NOTE — Patient Instructions (Addendum)
Take 2 of 20mg  daily  Will change Dyazide to Maxzide 1/2 tab  Low fat diary products

## 2014-11-23 HISTORY — PX: EYE SURGERY: SHX253

## 2014-11-30 ENCOUNTER — Other Ambulatory Visit: Payer: Self-pay | Admitting: *Deleted

## 2014-11-30 ENCOUNTER — Telehealth: Payer: Self-pay | Admitting: Endocrinology

## 2014-11-30 MED ORDER — LEVOTHYROXINE SODIUM 100 MCG PO TABS: 100.0000 ug | ORAL_TABLET | Freq: Every day | ORAL | Status: DC

## 2014-11-30 NOTE — Telephone Encounter (Signed)
Patient would like to have her Levothyroxine called in to [facility]  She will be going out of town    Thank you

## 2014-11-30 NOTE — Telephone Encounter (Signed)
rx sent

## 2014-12-04 ENCOUNTER — Other Ambulatory Visit: Payer: Medicare Other

## 2014-12-06 ENCOUNTER — Ambulatory Visit: Payer: Medicare Other | Admitting: Endocrinology

## 2014-12-11 DIAGNOSIS — J209 Acute bronchitis, unspecified: Secondary | ICD-10-CM | POA: Diagnosis not present

## 2014-12-11 DIAGNOSIS — R05 Cough: Secondary | ICD-10-CM | POA: Diagnosis not present

## 2014-12-24 ENCOUNTER — Telehealth: Payer: Self-pay | Admitting: Endocrinology

## 2014-12-24 ENCOUNTER — Other Ambulatory Visit: Payer: Self-pay | Admitting: *Deleted

## 2014-12-24 MED ORDER — LEVOTHYROXINE SODIUM 100 MCG PO TABS: 100.0000 ug | ORAL_TABLET | Freq: Every day | ORAL | Status: DC

## 2014-12-24 MED ORDER — GLIMEPIRIDE 1 MG PO TABS: 1.0000 mg | ORAL_TABLET | Freq: Every day | ORAL | Status: DC

## 2014-12-24 MED ORDER — BENAZEPRIL HCL 10 MG PO TABS: 10.0000 mg | ORAL_TABLET | Freq: Every day | ORAL | Status: DC

## 2014-12-24 MED ORDER — METFORMIN HCL 1000 MG PO TABS: 1000.0000 mg | ORAL_TABLET | Freq: Two times a day (BID) | ORAL | Status: DC

## 2014-12-24 NOTE — Telephone Encounter (Signed)
rx sent

## 2014-12-24 NOTE — Telephone Encounter (Signed)
[place] out of the [place]  Please refill her Rx  Glimpiride  Levothyroxine Benazepril  Metformin   Pharmacy: [facility] [place]   Thank you

## 2015-01-29 DIAGNOSIS — H6501 Acute serous otitis media, right ear: Secondary | ICD-10-CM | POA: Diagnosis not present

## 2015-01-29 DIAGNOSIS — J069 Acute upper respiratory infection, unspecified: Secondary | ICD-10-CM | POA: Diagnosis not present

## 2015-03-19 ENCOUNTER — Other Ambulatory Visit: Payer: Medicare Other

## 2015-03-21 ENCOUNTER — Ambulatory Visit: Payer: Medicare Other | Admitting: Endocrinology

## 2015-04-16 ENCOUNTER — Other Ambulatory Visit: Payer: Self-pay

## 2015-04-18 ENCOUNTER — Ambulatory Visit: Payer: Self-pay | Admitting: Endocrinology

## 2015-05-02 ENCOUNTER — Ambulatory Visit: Payer: Medicare Other | Admitting: Family Medicine

## 2015-05-02 DIAGNOSIS — H35371 Puckering of macula, right eye: Secondary | ICD-10-CM | POA: Diagnosis not present

## 2015-05-02 DIAGNOSIS — E119 Type 2 diabetes mellitus without complications: Secondary | ICD-10-CM | POA: Diagnosis not present

## 2015-05-02 DIAGNOSIS — H25813 Combined forms of age-related cataract, bilateral: Secondary | ICD-10-CM | POA: Diagnosis not present

## 2015-05-03 ENCOUNTER — Other Ambulatory Visit (INDEPENDENT_AMBULATORY_CARE_PROVIDER_SITE_OTHER): Payer: Medicare Other

## 2015-05-03 ENCOUNTER — Telehealth: Payer: Self-pay | Admitting: Family Medicine

## 2015-05-03 DIAGNOSIS — E785 Hyperlipidemia, unspecified: Secondary | ICD-10-CM

## 2015-05-03 DIAGNOSIS — E119 Type 2 diabetes mellitus without complications: Secondary | ICD-10-CM

## 2015-05-03 DIAGNOSIS — I10 Essential (primary) hypertension: Secondary | ICD-10-CM

## 2015-05-03 LAB — CBC WITH DIFFERENTIAL/PLATELET
Basophils Absolute: 0.1 10*3/uL (ref 0.0–0.1)
Basophils Relative: 1 % (ref 0–1)
Eosinophils Absolute: 0.2 10*3/uL (ref 0.0–0.7)
Eosinophils Relative: 2 % (ref 0–5)
HEMATOCRIT: 40.1 % (ref 36.0–46.0)
Hemoglobin: 12.9 g/dL (ref 12.0–15.0)
LYMPHS ABS: 1.7 10*3/uL (ref 0.7–4.0)
Lymphocytes Relative: 22 % (ref 12–46)
MCH: 25.3 pg — ABNORMAL LOW (ref 26.0–34.0)
MCHC: 32.2 g/dL (ref 30.0–36.0)
MCV: 78.8 fL (ref 78.0–100.0)
MPV: 9.1 fL (ref 8.6–12.4)
Monocytes Absolute: 0.5 10*3/uL (ref 0.1–1.0)
Monocytes Relative: 6 % (ref 3–12)
NEUTROS ABS: 5.5 10*3/uL (ref 1.7–7.7)
Neutrophils Relative %: 69 % (ref 43–77)
PLATELETS: 300 10*3/uL (ref 150–400)
RBC: 5.09 MIL/uL (ref 3.87–5.11)
RDW: 15.1 % (ref 11.5–15.5)
WBC: 7.9 10*3/uL (ref 4.0–10.5)

## 2015-05-03 NOTE — Addendum Note (Signed)
Addended by: Silvio Pate D on: 05/03/2015 04:26 PM   Modules accepted: Orders

## 2015-05-03 NOTE — Addendum Note (Signed)
Addended by: Silvio Pate D on: 05/03/2015 04:28 PM   Modules accepted: Orders

## 2015-05-03 NOTE — Telephone Encounter (Signed)
Patient informed and scheduled appt. Today .

## 2015-05-03 NOTE — Telephone Encounter (Signed)
Called the patient on both cell and home number left msg. To call back.  PCP received surgical clearance from [facility] for Cataract surgery 05/09/15.  PCP states patient needs labs to be done before she can clear for surgery.  I have ordered her labs she needs to call back and be informed/schedule lab appointment today 05/03/15 or Monday 05/06/15.

## 2015-05-04 LAB — LIPID PANEL
Cholesterol: 159 mg/dL (ref 0–200)
HDL: 34 mg/dL — ABNORMAL LOW (ref >=46)
LDL Cholesterol: 75 mg/dL (ref 0–99)
Total CHOL/HDL Ratio: 4.7 Ratio
Triglycerides: 248 mg/dL — ABNORMAL HIGH (ref <150)
VLDL: 50 mg/dL — ABNORMAL HIGH (ref 0–40)

## 2015-05-04 LAB — COMPREHENSIVE METABOLIC PANEL
ALK PHOS: 52 U/L (ref 39–117)
ALT: 34 U/L (ref 0–35)
AST: 21 U/L (ref 0–37)
Albumin: 4.1 g/dL (ref 3.5–5.2)
BILIRUBIN TOTAL: 0.5 mg/dL (ref 0.2–1.2)
BUN: 25 mg/dL — ABNORMAL HIGH (ref 6–23)
CO2: 23 meq/L (ref 19–32)
CREATININE: 1.07 mg/dL (ref 0.50–1.10)
Calcium: 10.2 mg/dL (ref 8.4–10.5)
Chloride: 102 mEq/L (ref 96–112)
Glucose, Bld: 146 mg/dL — ABNORMAL HIGH (ref 70–99)
Potassium: 4.2 mEq/L (ref 3.5–5.3)
Sodium: 139 mEq/L (ref 135–145)
TOTAL PROTEIN: 6.7 g/dL (ref 6.0–8.3)

## 2015-05-04 LAB — HEMOGLOBIN A1C
Hgb A1c MFr Bld: 7.3 % — ABNORMAL HIGH (ref <5.7)
MEAN PLASMA GLUCOSE: 163 mg/dL — AB (ref <117)

## 2015-05-04 LAB — TSH: TSH: 2.377 u[IU]/mL (ref 0.350–4.500)

## 2015-05-06 ENCOUNTER — Other Ambulatory Visit: Payer: Self-pay

## 2015-05-09 ENCOUNTER — Ambulatory Visit: Payer: Self-pay | Admitting: Endocrinology

## 2015-05-09 DIAGNOSIS — H2512 Age-related nuclear cataract, left eye: Secondary | ICD-10-CM | POA: Diagnosis not present

## 2015-05-16 ENCOUNTER — Telehealth: Payer: Self-pay

## 2015-05-16 NOTE — Telephone Encounter (Signed)
Pt called requesting Hgb A1C result.  Result discussed with patient.  No further questions or concerns voiced.

## 2015-05-20 DIAGNOSIS — H2511 Age-related nuclear cataract, right eye: Secondary | ICD-10-CM | POA: Diagnosis not present

## 2015-05-21 ENCOUNTER — Encounter: Payer: Self-pay | Admitting: Endocrinology

## 2015-05-21 ENCOUNTER — Ambulatory Visit (INDEPENDENT_AMBULATORY_CARE_PROVIDER_SITE_OTHER): Payer: Medicare Other | Admitting: Endocrinology

## 2015-05-21 VITALS — BP 126/72 | HR 71 | Temp 97.9°F | Resp 16 | Ht 70.0 in | Wt 230.0 lb

## 2015-05-21 DIAGNOSIS — I10 Essential (primary) hypertension: Secondary | ICD-10-CM | POA: Diagnosis not present

## 2015-05-21 DIAGNOSIS — E785 Hyperlipidemia, unspecified: Secondary | ICD-10-CM

## 2015-05-21 DIAGNOSIS — E1165 Type 2 diabetes mellitus with hyperglycemia: Secondary | ICD-10-CM

## 2015-05-21 DIAGNOSIS — IMO0002 Reserved for concepts with insufficient information to code with codable children: Secondary | ICD-10-CM

## 2015-05-21 NOTE — Patient Instructions (Signed)
Check blood sugars on waking up ..3  .. times a week Also check blood sugars about 2 hours after a meal and do this after different meals by rotation  Recommended blood sugar levels on waking up is 90-130 and about 2 hours after meal is 140-180 Please bring blood sugar monitor to each visit.  

## 2015-05-21 NOTE — Progress Notes (Signed)
Patient ID: Crystal Hodge, female   DOB: 1946-11-21, 69 y.o.   MRN: [id number]   Reason for Appointment: Diabetes follow-up   History of Present Illness    Diagnosis: Type 2 diabetes mellitus, date of diagnosis: 2008.   She has been on a regimen of Amaryl and metformin; was on Janumet but did not continue because of the cost especially in the [place]ut [place] Her A1c has been consistently just over 7% and now slightly better She has difficulty losing weight although she thinks her weight is stable to slightly better  She refuses to have her weight checked in the office, she thinks it is about 230 She has however been much more active with exercise  She takes Amaryl in the evening to avoid hypoglycemia midday She also has no blood sugars to show [place] on her home testing and she thinks previously they have been fairly good in the morning She forgot her monitor in [place] a few weeks ago  Monitors blood glucose: Less than once a day and recent readings are ranging between 107-130 Glucometer: Freestyle.  Hypoglycemia: None recently  Meals:  will have eggs or other protein with her breakfast   Exercise: At the gym with playing tennis type of game that she is doing in [place] and he thinks this is very active  Dietician visit: Most recent:5/11.   Complications: peripheral neuropathy, ? retinopathy.   Lab Results  Component Value Date   HGBA1C 7.3* 05/03/2015   HGBA1C 7.5* 10/30/2014   HGBA1C 7.3* 07/31/2014   Lab Results  Component Value Date   MICROALBUR 0.7 07/31/2014   LDLCALC 75 05/03/2015   CREATININE 1.07 05/03/2015      HYPERLIPIDEMIA:         The lipid abnormality consists of elevated LDL and low HDL. She has been compliant with her Lipitor40 mg and this was increased on her previous visit with improvement Nonfasting triglycerides are high but relatively better   Lab Results  Component Value Date   CHOL 159 05/03/2015   HDL 34* 05/03/2015   LDLCALC 75  05/03/2015   LDLDIRECT 121.8 10/30/2014   TRIG 248* 05/03/2015   CHOLHDL 4.7 05/03/2015    HYPOTHYROIDISM: She is compliant with her thyroid supplement and has no unusual fatigue.  TSH is usually normal with 100 mcg levothyroxine  Lab Results  Component Value Date   TSH 2.377 05/03/2015      No visits with results within 1 Week(s) from this visit. Latest known visit with results is:  Lab on 05/03/2015  Component Date Value Ref Range Status  . TSH 05/03/2015 2.377  0.350 - 4.500 uIU/mL Final  . Cholesterol 05/03/2015 159  0 - 200 mg/dL Final   Comment: ATP III Classification:       < 200        mg/dL        Desirable      200 - 239     mg/dL        Borderline High      >= 240        mg/dL        High     . Triglycerides 05/03/2015 248* <150 mg/dL Final  . HDL 05/03/2015 34* >=46 mg/dL Final   ** Please note change in reference range(s). **  . Total CHOL/HDL Ratio 05/03/2015 4.7   Final  . VLDL 05/03/2015 50* 0 - 40 mg/dL Final  .  LDL Cholesterol 05/03/2015 75  0 - 99 mg/dL Final   Comment:   Total Cholesterol/HDL Ratio:CHD Risk                        Coronary Heart Disease Risk Table                                        Men       Women          1/2 Average Risk              3.4        3.3              Average Risk              5.0        4.4           2X Average Risk              9.6        7.1           3X Average Risk             23.4       11.0 Use the calculated Patient Ratio above and the CHD Risk table  to determine the patient's CHD Risk. ATP III Classification (LDL):       < 100        mg/dL         Optimal      100 - 129     mg/dL         Near or Above Optimal      130 - 159     mg/dL         Borderline High      160 - 189     mg/dL         High       > 190        mg/dL         Very High     . Hgb A1c MFr Bld 05/03/2015 7.3* <5.7 % Final   Comment:                                                                        According to the ADA Clinical  Practice Recommendations for 2011, when HbA1c is used as a screening test:     >=6.5%   Diagnostic of Diabetes Mellitus            (if abnormal result is confirmed)   5.7-6.4%   Increased risk of developing Diabetes Mellitus   References:Diagnosis and Classification of Diabetes Mellitus,Diabetes UKGU,5427,06(CBJSE 1):S62-S69 and Standards of Medical Care in         Diabetes - 2011,Diabetes GBTD,1761,60 (Suppl 1):S11-S61.     . Mean Plasma Glucose 05/03/2015 163* <117 mg/dL Final  . Sodium 05/03/2015 139  135 - 145 mEq/L Final  . Potassium 05/03/2015 4.2  3.5 - 5.3 mEq/L Final  . Chloride 05/03/2015 102  96 - 112 mEq/L Final  . CO2 05/03/2015  23  19 - 32 mEq/L Final  . Glucose, Bld 05/03/2015 146* 70 - 99 mg/dL Final  . BUN 05/03/2015 25* 6 - 23 mg/dL Final  . Creat 05/03/2015 1.07  0.50 - 1.10 mg/dL Final  . Total Bilirubin 05/03/2015 0.5  0.2 - 1.2 mg/dL Final  . Alkaline Phosphatase 05/03/2015 52  39 - 117 U/L Final  . AST 05/03/2015 21  0 - 37 U/L Final  . ALT 05/03/2015 34  0 - 35 U/L Final  . Total Protein 05/03/2015 6.7  6.0 - 8.3 g/dL Final  . Albumin 05/03/2015 4.1  3.5 - 5.2 g/dL Final  . Calcium 05/03/2015 10.2  8.4 - 10.5 mg/dL Final  . WBC 05/03/2015 7.9  4.0 - 10.5 K/uL Final  . RBC 05/03/2015 5.09  3.87 - 5.11 MIL/uL Final  . Hemoglobin 05/03/2015 12.9  12.0 - 15.0 g/dL Final  . HCT 05/03/2015 40.1  36.0 - 46.0 % Final  . MCV 05/03/2015 78.8  78.0 - 100.0 fL Final  . MCH 05/03/2015 25.3* 26.0 - 34.0 pg Final  . MCHC 05/03/2015 32.2  30.0 - 36.0 g/dL Final  . RDW 05/03/2015 15.1  11.5 - 15.5 % Final  . Platelets 05/03/2015 300  150 - 400 K/uL Final  . MPV 05/03/2015 9.1  8.6 - 12.4 fL Final  . Neutrophils Relative % 05/03/2015 69  43 - 77 % Final  . Neutro Abs 05/03/2015 5.5  1.7 - 7.7 K/uL Final  . Lymphocytes Relative 05/03/2015 22  12 - 46 % Final  . Lymphs Abs 05/03/2015 1.7  0.7 - 4.0 K/uL Final  . Monocytes Relative 05/03/2015 6  3 - 12 % Final  .  Monocytes Absolute 05/03/2015 0.5  0.1 - 1.0 K/uL Final  . Eosinophils Relative 05/03/2015 2  0 - 5 % Final  . Eosinophils Absolute 05/03/2015 0.2  0.0 - 0.7 K/uL Final  . Basophils Relative 05/03/2015 1  0 - 1 % Final  . Basophils Absolute 05/03/2015 0.1  0.0 - 0.1 K/uL Final  . Smear Review 05/03/2015 Criteria for review not met   Final      Medication List       This list is accurate as of: 05/21/15  3:59 PM.  Always use your most recent med list.               aspirin 81 MG tablet  Take 81 mg by mouth every morning.     atorvastatin 40 MG tablet  Commonly known as:  LIPITOR  Take 1 tablet (40 mg total) by mouth every evening.     benazepril 10 MG tablet  Commonly known as:  LOTENSIN  Take 1 tablet (10 mg total) by mouth daily.     Co Q 10 10 MG Caps  Take 100 mg by mouth every morning.     docusate sodium 100 MG capsule  Commonly known as:  COLACE  Take 100 mg by mouth 2 (two) times daily.     Fiber Caps  Take 1 capsule by mouth every morning.     glimepiride 1 MG tablet  Commonly known as:  AMARYL  Take 1 tablet (1 mg total) by mouth daily before supper.     glucose blood test strip  Commonly known as:  FREESTYLE LITE  Use as instructed to check blood sugars 2 times per day dx code 250.00     levothyroxine 100 MCG tablet  Commonly known as:  SYNTHROID, LEVOTHROID  Take 1  tablet (100 mcg total) by mouth daily.     LORazepam 0.5 MG tablet  Commonly known as:  ATIVAN  Take 1 tablet (0.5 mg total) by mouth 2 (two) times daily as needed for anxiety or sleep.     metFORMIN 1000 MG tablet  Commonly known as:  GLUCOPHAGE  Take 1 tablet (1,000 mg total) by mouth 2 (two) times daily.     NON FORMULARY  1 tsp honey with [place] every morning.     triamterene-hydrochlorothiazide 37.5-25 MG per tablet  Commonly known as:  MAXZIDE-25  Take 0.5 tablets by mouth daily.     valACYclovir 1000 MG tablet  Commonly known as:  VALTREX        Allergies:  Allergies   Allergen Reactions  . Amoxicillin Diarrhea  . Prednisone     Hallucinations- can't take oral    Past Medical History  Diagnosis Date  . Diabetes mellitus     type II  . Hypertension   . Hyperlipidemia   . Thyroid disease   . Hypothyroidism   . Rectocele   . Menopause   . Obesity   . Tremor 04/07/2011  . Staph infection 09/12/2013  . Rectocele 09/12/2013  . Preventative health care 09/16/2013  . Acute upper respiratory infections of unspecified site 09/16/2013  . Dyspnea 11/19/2013  . Cervical cancer screening 01/30/2014  . Sun-damaged skin 02/04/2014  . Dermatitis 06/01/2014  . Medicare annual wellness visit, subsequent 09/16/2013    Has seen Dr Tonia Brooms of dermatology in past but presently struggling with insurance concerns may need referral to new derm, no concerns at present.   . Anxiety and depression 11/05/2014  . Abdominal pain, left lower quadrant 11/05/2014    Past Surgical History  Procedure Laterality Date  . Abdominal hysterectomy  07/28/95  . Carpal tunnel release  10/07    Right    Family History  Problem Relation Age of Onset  . Hyperlipidemia Mother   . Mental illness Mother   . Dementia Mother   . Hyperlipidemia Father   . Heart disease Father     triple bypass  . Diabetes Son     ?type I  . Mental illness Son     bipolar  . Depression Maternal Aunt   . Stroke Maternal Grandmother   . Mental illness Son   . Mental illness Brother     bipolar    Social History:  reports that she quit smoking about 24 years ago. Her smoking use included Cigarettes. She has a 15 pack-year smoking history. She has never used smokeless tobacco. She reports that she drinks alcohol. She reports that she does not use illicit drugs.  Review of Systems   She has long-standing hypothyroidism, has no unusual fatigue and is compliant with her medication Currently on 100 g levothyroxine TSH is usually normal   Lab Results  Component Value Date   TSH 2.377 05/03/2015        HYPERTENSION:  blood pressure is well controlled, currently using Lotensin 10 mg and a half Maxzide    No history of numbness or tingling/burning in her feet, diabetic Foot exam done in 6/16    Examination:   BP 126/72 mmHg  Pulse 71  Temp(Src) 97.9 F (36.6 C)  Resp 16  Ht $R'5\' 10"'zj$  (1.778 m)  Wt   SpO2 98%  There is no weight on file to calculate BMI.    no edema Diabetic foot exam shows normal monofilament sensation in the toes and  plantar surfaces, no skin lesions or ulcers on the feet and normal pedal pulses  Assesment/PLAN:  1. Diabetes type 2, uncontrolled   The patient's diabetes control appears to be about the same and A1c is still over 7% See history of present illness for detailed discussion of his current management, blood sugar patterns and problems identified She may be having some postprandial readings that are high which she is not monitoring again However lab glucose is not high after her evening meal and A1c has been fairly consistent despite no documented high readings She is currently on a regimen of maximum dose metformin along with low-dose Amaryl without hypoglycemia Recently has done well with doing regular exercise and is probably doing better with her diet with less stress compared to the last visit  2. Hypothyroidism: Adequately replaced on 100 g and will continue  3. Hypertension: Currently controlled  with Lotensin and Maxide, half of 25   4.  History of hypokalemia: resolved  5.  No evidence of neuropathy on exam  6.  HYPERLIPIDEMIA: LDL better with 40 mg Lipitor    Patient Instructions  Check blood sugars on waking up ..  3.. times a week Also check blood sugars about 2 hours after a meal and do this after different meals by rotation  Recommended blood sugar levels on waking up is 90-130 and about 2 hours after meal is 140-180 Please bring blood sugar monitor to each visit.        Crystal Hodge 05/21/2015, 3:59 PM

## 2015-05-23 DIAGNOSIS — H2511 Age-related nuclear cataract, right eye: Secondary | ICD-10-CM | POA: Diagnosis not present

## 2015-06-06 ENCOUNTER — Encounter: Payer: Self-pay | Admitting: Family Medicine

## 2015-06-06 ENCOUNTER — Ambulatory Visit (INDEPENDENT_AMBULATORY_CARE_PROVIDER_SITE_OTHER): Payer: Medicare Other | Admitting: Family Medicine

## 2015-06-06 VITALS — BP 126/80 | HR 83 | Temp 98.6°F | Ht 70.0 in

## 2015-06-06 DIAGNOSIS — Z1239 Encounter for other screening for malignant neoplasm of breast: Secondary | ICD-10-CM | POA: Diagnosis not present

## 2015-06-06 DIAGNOSIS — M79674 Pain in right toe(s): Secondary | ICD-10-CM

## 2015-06-06 DIAGNOSIS — M542 Cervicalgia: Secondary | ICD-10-CM

## 2015-06-06 DIAGNOSIS — M10079 Idiopathic gout, unspecified ankle and foot: Secondary | ICD-10-CM

## 2015-06-06 DIAGNOSIS — E669 Obesity, unspecified: Secondary | ICD-10-CM

## 2015-06-06 DIAGNOSIS — E119 Type 2 diabetes mellitus without complications: Secondary | ICD-10-CM

## 2015-06-06 DIAGNOSIS — E039 Hypothyroidism, unspecified: Secondary | ICD-10-CM

## 2015-06-06 DIAGNOSIS — E1169 Type 2 diabetes mellitus with other specified complication: Secondary | ICD-10-CM

## 2015-06-06 DIAGNOSIS — I10 Essential (primary) hypertension: Secondary | ICD-10-CM

## 2015-06-06 DIAGNOSIS — L578 Other skin changes due to chronic exposure to nonionizing radiation: Secondary | ICD-10-CM | POA: Diagnosis not present

## 2015-06-06 DIAGNOSIS — M109 Gout, unspecified: Secondary | ICD-10-CM

## 2015-06-06 DIAGNOSIS — Z124 Encounter for screening for malignant neoplasm of cervix: Secondary | ICD-10-CM

## 2015-06-06 NOTE — Progress Notes (Signed)
Pre visit review using our clinic review tool, if applicable. No additional management support is needed unless otherwise documented below in the visit note. 

## 2015-06-06 NOTE — Patient Instructions (Signed)
Try salon pas gel to the foot   Gout Gout is an inflammatory arthritis caused by a buildup of uric acid crystals in the joints. Uric acid is a chemical that is normally present in the blood. When the level of uric acid in the blood is too high it can form crystals that deposit in your joints and tissues. This causes joint redness, soreness, and swelling (inflammation). Repeat attacks are common. Over time, uric acid crystals can form into masses (tophi) near a joint, destroying bone and causing disfigurement. Gout is treatable and often preventable. CAUSES  The disease begins with elevated levels of uric acid in the blood. Uric acid is produced by your body when it breaks down a naturally found substance called purines. Certain foods you eat, such as meats and fish, contain high amounts of purines. Causes of an elevated uric acid level include:  Being passed down from parent to child (heredity).  Diseases that cause increased uric acid production (such as obesity, psoriasis, and certain cancers).  Excessive alcohol use.  Diet, especially diets rich in meat and seafood.  Medicines, including certain cancer-fighting medicines (chemotherapy), water pills (diuretics), and aspirin.  Chronic kidney disease. The kidneys are no longer able to remove uric acid well.  Problems with metabolism. Conditions strongly associated with gout include:  Obesity.  High blood pressure.  High cholesterol.  Diabetes. Not everyone with elevated uric acid levels gets gout. It is not understood why some people get gout and others do not. Surgery, joint injury, and eating too much of certain foods are some of the factors that can lead to gout attacks. SYMPTOMS   An attack of gout comes on quickly. It causes intense pain with redness, swelling, and warmth in a joint.  Fever can occur.  Often, only one joint is involved. Certain joints are more commonly involved:  Base of the big  toe.  Knee.  Ankle.  Wrist.  Finger. Without treatment, an attack usually goes away in a few days to weeks. Between attacks, you usually will not have symptoms, which is different from many other forms of arthritis. DIAGNOSIS  Your caregiver will suspect gout based on your symptoms and exam. In some cases, tests may be recommended. The tests may include:  Blood tests.  Urine tests.  X-rays.  Joint fluid exam. This exam requires a needle to remove fluid from the joint (arthrocentesis). Using a microscope, gout is confirmed when uric acid crystals are seen in the joint fluid. TREATMENT  There are two phases to gout treatment: treating the sudden onset (acute) attack and preventing attacks (prophylaxis).  Treatment of an Acute Attack.  Medicines are used. These include anti-inflammatory medicines or steroid medicines.  An injection of steroid medicine into the affected joint is sometimes necessary.  The painful joint is rested. Movement can worsen the arthritis.  You may use warm or cold treatments on painful joints, depending which works best for you.  Treatment to Prevent Attacks.  If you suffer from frequent gout attacks, your caregiver may advise preventive medicine. These medicines are started after the acute attack subsides. These medicines either help your kidneys eliminate uric acid from your body or decrease your uric acid production. You may need to stay on these medicines for a very long time.  The early phase of treatment with preventive medicine can be associated with an increase in acute gout attacks. For this reason, during the first few months of treatment, your caregiver may also advise you to take medicines  usually used for acute gout treatment. Be sure you understand your caregiver's directions. Your caregiver may make several adjustments to your medicine dose before these medicines are effective.  Discuss dietary treatment with your caregiver or dietitian.  Alcohol and drinks high in sugar and fructose and foods such as meat, poultry, and seafood can increase uric acid levels. Your caregiver or dietitian can advise you on drinks and foods that should be limited. HOME CARE INSTRUCTIONS   Do not take aspirin to relieve pain. This raises uric acid levels.  Only take over-the-counter or prescription medicines for pain, discomfort, or fever as directed by your caregiver.  Rest the joint as much as possible. When in bed, keep sheets and blankets off painful areas.  Keep the affected joint raised (elevated).  Apply warm or cold treatments to painful joints. Use of warm or cold treatments depends on which works best for you.  Use crutches if the painful joint is in your leg.  Drink enough fluids to keep your urine clear or pale yellow. This helps your body get rid of uric acid. Limit alcohol, sugary drinks, and fructose drinks.  Follow your dietary instructions. Pay careful attention to the amount of protein you eat. Your daily diet should emphasize fruits, vegetables, whole grains, and fat-free or low-fat milk products. Discuss the use of coffee, vitamin C, and cherries with your caregiver or dietitian. These may be helpful in lowering uric acid levels.  Maintain a healthy body weight. SEEK MEDICAL CARE IF:   You develop diarrhea, vomiting, or any side effects from medicines.  You do not feel better in 24 hours, or you are getting worse. SEEK IMMEDIATE MEDICAL CARE IF:   Your joint becomes suddenly more tender, and you have chills or a fever. MAKE SURE YOU:   Understand these instructions.  Will watch your condition.  Will get help right away if you are not doing well or get worse. Document Released: 11/06/2000 Document Revised: 03/26/2014 Document Reviewed: 06/22/2012 [facility] Patient Information 2015 [place], [place]. This information is not intended to replace advice given to you by your health care provider. Make sure you discuss any  questions you have with your health care provider.

## 2015-06-07 LAB — URIC ACID: Uric Acid, Serum: 7.4 mg/dL — ABNORMAL HIGH (ref 2.4–7.0)

## 2015-06-10 ENCOUNTER — Other Ambulatory Visit: Payer: Self-pay | Admitting: Family Medicine

## 2015-06-10 ENCOUNTER — Encounter: Payer: Self-pay | Admitting: Medical

## 2015-06-10 ENCOUNTER — Telehealth: Payer: Self-pay | Admitting: Family Medicine

## 2015-06-10 ENCOUNTER — Ambulatory Visit (HOSPITAL_BASED_OUTPATIENT_CLINIC_OR_DEPARTMENT_OTHER)
Admission: RE | Admit: 2015-06-10 | Discharge: 2015-06-10 | Disposition: A | Discharge status: Home / Self Care | Payer: Medicare Other | Source: Ambulatory Visit | Attending: Family Medicine | Admitting: Family Medicine

## 2015-06-10 ENCOUNTER — Ambulatory Visit (INDEPENDENT_AMBULATORY_CARE_PROVIDER_SITE_OTHER): Payer: Medicare Other | Admitting: Medical

## 2015-06-10 VITALS — BP 143/67 | HR 71 | Temp 99.2°F | Ht 70.0 in

## 2015-06-10 DIAGNOSIS — Z1231 Encounter for screening mammogram for malignant neoplasm of breast: Secondary | ICD-10-CM | POA: Insufficient documentation

## 2015-06-10 DIAGNOSIS — H6502 Acute serous otitis media, left ear: Secondary | ICD-10-CM

## 2015-06-10 DIAGNOSIS — S46812A Strain of other muscles, fascia and tendons at shoulder and upper arm level, left arm, initial encounter: Secondary | ICD-10-CM | POA: Diagnosis not present

## 2015-06-10 DIAGNOSIS — M5033 Other cervical disc degeneration, cervicothoracic region: Secondary | ICD-10-CM | POA: Insufficient documentation

## 2015-06-10 DIAGNOSIS — M542 Cervicalgia: Secondary | ICD-10-CM

## 2015-06-10 DIAGNOSIS — Z1239 Encounter for other screening for malignant neoplasm of breast: Secondary | ICD-10-CM

## 2015-06-10 DIAGNOSIS — M25512 Pain in left shoulder: Secondary | ICD-10-CM | POA: Diagnosis not present

## 2015-06-10 DIAGNOSIS — M5031 Other cervical disc degeneration,  high cervical region: Secondary | ICD-10-CM | POA: Diagnosis not present

## 2015-06-10 DIAGNOSIS — I6523 Occlusion and stenosis of bilateral carotid arteries: Secondary | ICD-10-CM

## 2015-06-10 DIAGNOSIS — M5032 Other cervical disc degeneration, mid-cervical region: Secondary | ICD-10-CM | POA: Diagnosis not present

## 2015-06-10 MED ORDER — TIZANIDINE HCL 4 MG PO TABS: 4.0000 mg | ORAL_TABLET | Freq: Every day | ORAL | Status: DC

## 2015-06-10 MED ORDER — TRAMADOL HCL 50 MG PO TABS
50.0000 mg | ORAL_TABLET | Freq: Three times a day (TID) | ORAL | Status: DC | PRN
Start: 2015-06-10 — End: 2015-08-21

## 2015-06-10 MED ORDER — CEFDINIR 300 MG PO CAPS: 300.0000 mg | ORAL_CAPSULE | Freq: Two times a day (BID) | ORAL | Status: DC

## 2015-06-10 MED ORDER — ALLOPURINOL 100 MG PO TABS: 100.0000 mg | ORAL_TABLET | Freq: Every day | ORAL | Status: DC

## 2015-06-10 NOTE — Progress Notes (Signed)
Subjective:    Patient ID: Crystal Hodge, female    DOB: [birth year], 69 y.o.   MRN: [id number]  HPI  Pt in with moderate neck pain.(At first stated a lot then by end of exam stated moderate)  Pt states she can hear crepitus when she moves her neck. Pt states pain in her neck has been present for 3 months. This pain is not acute.  Moderate multilevel DDD on xray. Saw Dr. Abner Greenspan for this other day.  Yesterday morning left ear pain. Sharp pain in let ear came on. Left side ear pain moderate to severe when it occurs. With some pain behind ear.   Some pain left shoulder area recently.(but on exam more trapezius region posterior to shoulder.   Sometimes feel like difficulty speaking. She states some change of her voice. Trying to record song on her machine but can't carry a tune well recenlty.Pt is belching today. She states this is rare. Denies reflux. Offered referral to ent(for laryngoscopy) today and she refused.    Review of Systems  Constitutional: Negative for fever, chills and fatigue.  HENT: Positive for ear pain and voice change. Negative for congestion, drooling, hearing loss, mouth sores, postnasal drip, rhinorrhea, sneezing and sore throat.   Respiratory: Negative for cough, chest tightness, shortness of breath and wheezing.   Cardiovascular: Negative for chest pain and palpitations.  Gastrointestinal: Positive for nausea. Negative for abdominal pain, diarrhea, constipation, blood in stool, abdominal distention and rectal pain.       Some belching today.  Musculoskeletal: Positive for neck pain. Negative for back pain and gait problem.  Neurological: Negative for dizziness and headaches.       Baseline chronic vertigo.  Hematological: Negative for adenopathy. Does not bruise/bleed easily.   Past Medical History  Diagnosis Date  . Diabetes mellitus     type II  . Hypertension   . Hyperlipidemia   . Thyroid disease   . Hypothyroidism   . Rectocele   . Menopause   .  Obesity   . Tremor 04/07/2011  . Staph infection 09/12/2013  . Rectocele 09/12/2013  . Preventative health care 09/16/2013  . Acute upper respiratory infections of unspecified site 09/16/2013  . Dyspnea 11/19/2013  . Cervical cancer screening 01/30/2014  . Sun-damaged skin 02/04/2014  . Dermatitis 06/01/2014  . Medicare annual wellness visit, subsequent 09/16/2013    Has seen Dr Danella Deis of dermatology in past but presently struggling with insurance concerns may need referral to new derm, no concerns at present.   . Anxiety and depression 11/05/2014  . Abdominal pain, left lower quadrant 11/05/2014    History   Social History  . Marital Status: Widowed    Spouse Name: N/A  . Number of Children: 2  . Years of Education: HS   Occupational History  . Retired    Social History Main Topics  . Smoking status: Former Smoker -- 0.50 packs/day for 30 years    Types: Cigarettes    Quit date: 11/23/1990  . Smokeless tobacco: Never Used  . Alcohol Use: Yes     Comment: occasional  . Drug Use: No  . Sexual Activity:    Partners: Male   Other Topics Concern  . Not on file   Social History Narrative   Caffeine: 1 mug coffee daily   Regular exercise: plays tennis.          Past Surgical History  Procedure Laterality Date  . Abdominal hysterectomy  07/28/95  . Carpal tunnel  release  10/07    Right  . Eye surgery Bilateral 2016    cataracts    Family History  Problem Relation Age of Onset  . Hyperlipidemia Mother   . Mental illness Mother   . Dementia Mother   . Hyperlipidemia Father   . Heart disease Father     triple bypass  . Diabetes Son     ?type I  . Mental illness Son     bipolar  . Depression Maternal Aunt   . Stroke Maternal Grandmother   . Mental illness Son   . Mental illness Brother     bipolar    Allergies  Allergen Reactions  . Amoxicillin Diarrhea  . Prednisone     Hallucinations- can't take oral    Current Outpatient Prescriptions on File Prior  to Visit  Medication Sig Dispense Refill  . allopurinol (ZYLOPRIM) 100 MG tablet Take 1 tablet (100 mg total) by mouth daily. 30 tablet 3  . aspirin 81 MG tablet Take 81 mg by mouth every morning.      Marland Kitchen atorvastatin (LIPITOR) 40 MG tablet Take 1 tablet (40 mg total) by mouth every evening. 90 tablet 3  . benazepril (LOTENSIN) 10 MG tablet Take 1 tablet (10 mg total) by mouth daily. 90 tablet 1  . Coenzyme Q10 (CO Q 10) 10 MG CAPS Take 100 mg by mouth every morning.      . docusate sodium (COLACE) 100 MG capsule Take 100 mg by mouth 2 (two) times daily.      . Fiber CAPS Take 1 capsule by mouth every morning.      Marland Kitchen glimepiride (AMARYL) 1 MG tablet Take 1 tablet (1 mg total) by mouth daily before supper. 90 tablet 1  . glucose blood (FREESTYLE LITE) test strip Use as instructed to check blood sugars 2 times per day dx code 250.00 100 each 12  . levothyroxine (SYNTHROID, LEVOTHROID) 100 MCG tablet Take 1 tablet (100 mcg total) by mouth daily. 90 tablet 1  . LORazepam (ATIVAN) 0.5 MG tablet Take 1 tablet (0.5 mg total) by mouth 2 (two) times daily as needed for anxiety or sleep. 30 tablet 1  . metFORMIN (GLUCOPHAGE) 1000 MG tablet Take 1 tablet (1,000 mg total) by mouth 2 (two) times daily. 180 tablet 1  . NON FORMULARY 1 tsp honey with Manuka every morning.     . triamterene-hydrochlorothiazide (MAXZIDE-25) 37.5-25 MG per tablet Take 0.5 tablets by mouth daily. 45 tablet 3  . valACYclovir (VALTREX) 1000 MG tablet      No current facility-administered medications on file prior to visit.    BP 143/67 mmHg  Pulse 71  Temp(Src) 99.2 F (37.3 C) (Oral)  Ht 5\' 10"  (1.778 m)  Wt   SpO2 100%       Objective:   Physical Exam  General Mental Status- Alert. General Appearance- Not in acute distress.   Skin General: Color- Normal Color. Moisture- Normal Moisture.  Neck Carotid Arteries- Normal color. Moisture- Normal Moisture. No carotid bruits. No JVD. Lt trapezius tenderness to  palpation as inserts into occipital region.  Chest and Lung Exam Auscultation: Breath Sounds:-Normal. CTA  Cardiovascular Auscultation:Rythm- Regular, Rate and Rythm Murmurs & Other Heart Sounds:Auscultation of the heart reveals- No Murmurs.  Abdomen Inspection:-Inspeection Normal. Palpation/Percussion:Note:No mass. Palpation and Percussion of the abdomen reveal- Non Tender, Non Distended + BS, no rebound or guarding.    Neurologic Cranial Nerve exam:- CN III-XII intact(No nystagmus), symmetric smile. Drift Test:- No drift. Romberg  Exam:- Negative.  Heal to Toe Gait exam: intact but difficult(not new gait problems.) hx of vertigo Finger to Nose:- Normal/Intact Strength:- 5/5 equal and symmetric strength both upper and lower extremities.    HEENT Head- Normal. Ear Auditory Canal - Left- Normal. Right - Normal.Tympanic Membrane- Left- mild central redness.(slight pain on palpation mastoid region )Right- Normal. Eye Sclera/Conjunctiva- Left- Normal. Right- Normal. Nose & Sinuses Nasal Mucosa- Left-  Boggy and Congested. Right-  Boggy and  Congested.Bilateral maxillary and frontal sinus pressure. Mouth & Throat Lips: Upper Lip- Normal: no dryness, cracking, pallor, cyanosis, or vesicular eruption. Lower Lip-Normal: no dryness, cracking, pallor, cyanosis or vesicular eruption. Buccal Mucosa- Bilateral- No Aphthous ulcers. Oropharynx- No Discharge or Erythema. Tonsils: Characteristics- Bilateral- No Erythema or Congestion. Size/Enlargement- Bilateral- No enlargement. Discharge- bilateral-None.    Lymphatic Head & Neck General Head & Neck Lymphatics: Bilateral: Description- No Localized lymphadenopathy.  Lt shoulder- on palpation and rom. No pain.       Assessment & Plan:  For otitis media- cefdnir rx.  Lt trapezius strainn/neck pain- rx zanaflex and tramadol  Lt shoulder pain- this is more trapezius pain.  Regarding you voice change would offer your ranitidine as  maybe some reflux occuring.(But rx zantac declined) Also declined offer to refer to ENT. To evaluate larynx.  Follow up 10 days or as needed.

## 2015-06-10 NOTE — Telephone Encounter (Signed)
So I have ordered her MRI of neck so that will find any serious findings in neck which might contribute to ear pain

## 2015-06-10 NOTE — Telephone Encounter (Signed)
Caller name: Wille Celestejanie Relation to pt: self Call back number: [phone] Pharmacy:  Reason for call:   Patient states that she has been having ear pain and is wanting to know if that could be connected to her neck pain. Has an Xray scheduled for this morning and wants to know if something else should be ordered as well.

## 2015-06-10 NOTE — Telephone Encounter (Signed)
Error

## 2015-06-10 NOTE — Telephone Encounter (Signed)
Patient informed MRI ordered.

## 2015-06-10 NOTE — Progress Notes (Signed)
Pre visit review using our clinic review tool, if applicable. No additional management support is needed unless otherwise documented below in the visit note. 

## 2015-06-10 NOTE — Patient Instructions (Addendum)
For otitis media- cefdnir rx.  Lt trapezius strainn/neck pain- rx zanaflex and tramadol  Lt shoulder pain- this is more trapezius pain.  Regarding you voice change would offer your ranitidine as maybe some reflux occuring.(But rx zantac declined)  Follow up 10 days or as needed.

## 2015-06-13 ENCOUNTER — Telehealth: Payer: Self-pay | Admitting: Family Medicine

## 2015-06-13 NOTE — Telephone Encounter (Signed)
Self Tel [phone].   Pt called again regarding about message below and pt is really wanting to know if she can take colcrys with the other gout medication that she is taking. Pt is wanting to know ASAP. Pt also wants to know when will the medication for gout that she takes daily starts working on her. Pt has more question to make about her referral for dermatologists since pt wants a female dermatologist. Please advise.

## 2015-06-13 NOTE — Telephone Encounter (Signed)
Yes she can take Colcrys with her other meds. If she uses it aggressively it often works within the day. Colcrys 0.6 mg tabs 2 tabs po then 1 tab every 2 hours until she has pain resolution, diarrhea or a max of 6 tabs in 24 hours. Disp # 6, I have asked referral coordinator to change her to a female dermatologist.

## 2015-06-13 NOTE — Telephone Encounter (Signed)
Relation to pt: self Call back number: [phone]   Reason for call:  Patient states allopurinol (ZYLOPRIM) 100 MG tablet is not working and would like to take colcrys and allopurinol at the same time regarding gout. Please advise

## 2015-06-14 MED ORDER — COLCHICINE 0.6 MG PO TABS: ORAL_TABLET | ORAL | Status: DC

## 2015-06-14 NOTE — Telephone Encounter (Signed)
Called and [facility] @ 8:31am @ [phone]) asking pt to RTC regarding the note below.//AB/CMA

## 2015-06-14 NOTE — Telephone Encounter (Signed)
Spoke with the pt and informed her of the note below.  Pt verbalized understanding.  New rx sent to the pharmary.  Pt stated that she was able to make her own appt with a female dermatologist (Dr. Charlton Haws) on Wed (06/19/15).//AB/CMA

## 2015-06-15 ENCOUNTER — Ambulatory Visit (HOSPITAL_BASED_OUTPATIENT_CLINIC_OR_DEPARTMENT_OTHER)
Admission: RE | Admit: 2015-06-15 | Discharge: 2015-06-15 | Disposition: A | Discharge status: Home / Self Care | Payer: Medicare Other | Source: Ambulatory Visit | Attending: Family Medicine | Admitting: Family Medicine

## 2015-06-15 DIAGNOSIS — M542 Cervicalgia: Secondary | ICD-10-CM

## 2015-06-15 DIAGNOSIS — M2578 Osteophyte, vertebrae: Secondary | ICD-10-CM | POA: Insufficient documentation

## 2015-06-15 DIAGNOSIS — E785 Hyperlipidemia, unspecified: Secondary | ICD-10-CM | POA: Diagnosis not present

## 2015-06-15 DIAGNOSIS — E119 Type 2 diabetes mellitus without complications: Secondary | ICD-10-CM | POA: Insufficient documentation

## 2015-06-15 DIAGNOSIS — M4802 Spinal stenosis, cervical region: Secondary | ICD-10-CM | POA: Diagnosis not present

## 2015-06-15 DIAGNOSIS — I10 Essential (primary) hypertension: Secondary | ICD-10-CM | POA: Insufficient documentation

## 2015-06-15 DIAGNOSIS — I6523 Occlusion and stenosis of bilateral carotid arteries: Secondary | ICD-10-CM

## 2015-06-15 DIAGNOSIS — M47812 Spondylosis without myelopathy or radiculopathy, cervical region: Secondary | ICD-10-CM | POA: Diagnosis not present

## 2015-06-17 ENCOUNTER — Other Ambulatory Visit: Payer: Self-pay | Admitting: Family Medicine

## 2015-06-17 DIAGNOSIS — M542 Cervicalgia: Secondary | ICD-10-CM

## 2015-06-19 DIAGNOSIS — M5412 Radiculopathy, cervical region: Secondary | ICD-10-CM | POA: Diagnosis not present

## 2015-06-19 DIAGNOSIS — L309 Dermatitis, unspecified: Secondary | ICD-10-CM | POA: Diagnosis not present

## 2015-06-19 DIAGNOSIS — Z1283 Encounter for screening for malignant neoplasm of skin: Secondary | ICD-10-CM | POA: Diagnosis not present

## 2015-06-24 ENCOUNTER — Encounter: Payer: Self-pay | Admitting: Family Medicine

## 2015-06-24 DIAGNOSIS — M79674 Pain in right toe(s): Secondary | ICD-10-CM | POA: Insufficient documentation

## 2015-06-24 DIAGNOSIS — Z1239 Encounter for other screening for malignant neoplasm of breast: Secondary | ICD-10-CM | POA: Insufficient documentation

## 2015-06-24 DIAGNOSIS — M542 Cervicalgia: Secondary | ICD-10-CM | POA: Insufficient documentation

## 2015-06-24 NOTE — Assessment & Plan Note (Signed)
Encouraged moist heat and gentle stretching as tolerated. May try NSAIDs and prescription meds as directed and report if symptoms worsen or seek immediate care MRI showed Multi-level cervical spondylotic changes as detailed above. It is difficult to state which level may be responsible for the patient's symptoms.  Findings are more notable on the left at the C2-3, C3-4 C4-5 and C7-T1 level.  Findings are more notable on the right at the C5-6 and C6-7 level.  Referred to neurosurgery for consultation

## 2015-06-24 NOTE — Assessment & Plan Note (Signed)
Well controlled, no changes to meds. Encouraged heart healthy diet such as the DASH diet and exercise as tolerated.  °

## 2015-06-24 NOTE — Assessment & Plan Note (Signed)
Encouraged increased hydration, gout diet and Allopurinol and Colchicine.

## 2015-06-24 NOTE — Progress Notes (Signed)
Crystal Hodge  [id number] 07-05-1946 06/24/2015      Progress Note-Follow Up  Subjective  Chief Complaint  Chief Complaint  Patient presents with  . Follow-up    6 month  . Gout    HPI  Patient is a 69 y.o. female in today for routine medical care. Patient in today with numerous complaints. Is has pain in her right great thumb with some warmth noted. Complaining of her thumb  Her greatest complaint is neck pain, no acute injury. Also notes some pruritus on her back. Denies CP/palp/SOB/HA/congestion/fevers/GI or GU c/o. Taking meds as prescribed  Past Medical History  Diagnosis Date  . Diabetes mellitus     type II  . Hypertension   . Hyperlipidemia   . Thyroid disease   . Hypothyroidism   . Rectocele   . Menopause   . Obesity   . Tremor 04/07/2011  . Staph infection 09/12/2013  . Rectocele 09/12/2013  . Preventative health care 09/16/2013  . Acute upper respiratory infections of unspecified site 09/16/2013  . Dyspnea 11/19/2013  . Cervical cancer screening 01/30/2014  . Sun-damaged skin 02/04/2014  . Dermatitis 06/01/2014  . Medicare annual wellness visit, subsequent 09/16/2013    Has seen Dr Danella Deis of dermatology in past but presently struggling with insurance concerns may need referral to new derm, no concerns at present.   . Anxiety and depression 11/05/2014  . Abdominal pain, left lower quadrant 11/05/2014    Past Surgical History  Procedure Laterality Date  . Abdominal hysterectomy  07/28/95  . Carpal tunnel release  10/07    Right  . Eye surgery Bilateral 2016    cataracts    Family History  Problem Relation Age of Onset  . Hyperlipidemia Mother   . Mental illness Mother   . Dementia Mother   . Hyperlipidemia Father   . Heart disease Father     triple bypass  . Diabetes Son     ?type I  . Mental illness Son     bipolar  . Depression Maternal Aunt   . Stroke Maternal Grandmother   . Mental illness Son   . Mental illness Brother     bipolar     History   Social History  . Marital Status: Widowed    Spouse Name: N/A  . Number of Children: 2  . Years of Education: HS   Occupational History  . Retired    Social History Main Topics  . Smoking status: Former Smoker -- 0.50 packs/day for 30 years    Types: Cigarettes    Quit date: 11/23/1990  . Smokeless tobacco: Never Used  . Alcohol Use: Yes     Comment: occasional  . Drug Use: No  . Sexual Activity:    Partners: Male   Other Topics Concern  . Not on file   Social History Narrative   Caffeine: 1 mug coffee daily   Regular exercise: plays tennis.          Current Outpatient Prescriptions on File Prior to Visit  Medication Sig Dispense Refill  . aspirin 81 MG tablet Take 81 mg by mouth every morning.      Marland Kitchen atorvastatin (LIPITOR) 40 MG tablet Take 1 tablet (40 mg total) by mouth every evening. 90 tablet 3  . benazepril (LOTENSIN) 10 MG tablet Take 1 tablet (10 mg total) by mouth daily. 90 tablet 1  . Coenzyme Q10 (CO Q 10) 10 MG CAPS Take 100 mg by mouth every morning.      Marland Kitchen  docusate sodium (COLACE) 100 MG capsule Take 100 mg by mouth 2 (two) times daily.      . Fiber CAPS Take 1 capsule by mouth every morning.      Marland Kitchen glimepiride (AMARYL) 1 MG tablet Take 1 tablet (1 mg total) by mouth daily before supper. 90 tablet 1  . glucose blood (FREESTYLE LITE) test strip Use as instructed to check blood sugars 2 times per day dx code 250.00 100 each 12  . levothyroxine (SYNTHROID, LEVOTHROID) 100 MCG tablet Take 1 tablet (100 mcg total) by mouth daily. 90 tablet 1  . metFORMIN (GLUCOPHAGE) 1000 MG tablet Take 1 tablet (1,000 mg total) by mouth 2 (two) times daily. 180 tablet 1  . NON FORMULARY 1 tsp honey with Manuka every morning.     . triamterene-hydrochlorothiazide (MAXZIDE-25) 37.5-25 MG per tablet Take 0.5 tablets by mouth daily. 45 tablet 3  . LORazepam (ATIVAN) 0.5 MG tablet Take 1 tablet (0.5 mg total) by mouth 2 (two) times daily as needed for anxiety or  sleep. 30 tablet 1  . valACYclovir (VALTREX) 1000 MG tablet      No current facility-administered medications on file prior to visit.    Allergies  Allergen Reactions  . Amoxicillin Diarrhea  . Prednisone     Hallucinations- can't take oral    Review of Systems  Review of Systems  Constitutional: Positive for malaise/fatigue. Negative for fever.  HENT: Negative for congestion.   Eyes: Negative for discharge.  Respiratory: Negative for shortness of breath.   Cardiovascular: Negative for chest pain, palpitations and leg swelling.  Gastrointestinal: Negative for nausea, abdominal pain and diarrhea.  Genitourinary: Negative for dysuria.  Musculoskeletal: Positive for joint pain and neck pain. Negative for falls.       Right great toe  Skin: Negative for rash.  Neurological: Negative for loss of consciousness and headaches.  Endo/Heme/Allergies: Negative for polydipsia.  Psychiatric/Behavioral: Negative for depression and suicidal ideas. The patient is not nervous/anxious and does not have insomnia.     Objective  BP 126/80 mmHg  Pulse 83  Temp(Src) 98.6 F (37 C) (Oral)  Ht  (1.778 m)  Wt   SpO2 97%  Physical Exam  Physical Exam  Constitutional: She is oriented to person, place, and time and well-developed, well-nourished, and in no distress. No distress.  HENT:  Head: Normocephalic and atraumatic.  Eyes: Conjunctivae are normal.  Neck: Neck supple. No thyromegaly present.  Cardiovascular: Normal rate, regular rhythm and normal heart sounds.   No murmur heard. Pulmonary/Chest: Effort normal and breath sounds normal. She has no wheezes.  Abdominal: She exhibits no distension and no mass.  Musculoskeletal: She exhibits no edema.  Lymphadenopathy:    She has no cervical adenopathy.  Neurological: She is alert and oriented to person, place, and time.  Skin: Skin is warm and dry. No rash noted. She is not diaphoretic.  Psychiatric: Memory, affect and judgment  normal.    Lab Results  Component Value Date   TSH 2.377 05/03/2015   Lab Results  Component Value Date   WBC 7.9 05/03/2015   HGB 12.9 05/03/2015   HCT 40.1 05/03/2015   MCV 78.8 05/03/2015   PLT 300 05/03/2015   Lab Results  Component Value Date   CREATININE 1.07 05/03/2015   BUN 25* 05/03/2015   NA 139 05/03/2015   K 4.2 05/03/2015   CL 102 05/03/2015   CO2 23 05/03/2015   Lab Results  Component Value Date   ALT  34 05/03/2015   AST 21 05/03/2015   ALKPHOS 52 05/03/2015   BILITOT 0.5 05/03/2015   Lab Results  Component Value Date   CHOL 159 05/03/2015   Lab Results  Component Value Date   HDL 34* 05/03/2015   Lab Results  Component Value Date   LDLCALC 75 05/03/2015   Lab Results  Component Value Date   TRIG 248* 05/03/2015   Lab Results  Component Value Date   CHOLHDL 4.7 05/03/2015     Assessment & Plan  HTN (hypertension) Well controlled, no changes to meds. Encouraged heart healthy diet such as the DASH diet and exercise as tolerated.   Hypothyroidism On Levothyroxine, continue to monitor  Diabetes mellitus type 2 in obese hgba1c acceptable, mildly elevated A1C encouragedminimize simple carbs. Increase exercise as tolerated. Continue current meds  Cervical cancer screening Encouraged moist heat and gentle stretching as tolerated. May try NSAIDs and prescription meds as directed and report if symptoms worsen or seek immediate care MRI showed Multi-level cervical spondylotic changes as detailed above. It is difficult to state which level may be responsible for the patient's symptoms.  Findings are more notable on the left at the C2-3, C3-4 C4-5 and C7-T1 level.  Findings are more notable on the right at the C5-6 and C6-7 level.  Referred to neurosurgery for consultation  Acute gout Encouraged increased hydration, gout diet and Allopurinol and Colchicine.  Breast cancer screening MGM ordered today

## 2015-06-24 NOTE — Assessment & Plan Note (Signed)
MGM ordered today 

## 2015-06-24 NOTE — Assessment & Plan Note (Signed)
On Levothyroxine, continue to monitor 

## 2015-06-24 NOTE — Assessment & Plan Note (Signed)
hgba1c acceptable, mildly elevated A1C encouragedminimize simple carbs. Increase exercise as tolerated. Continue current meds

## 2015-06-26 DIAGNOSIS — M6281 Muscle weakness (generalized): Secondary | ICD-10-CM | POA: Diagnosis not present

## 2015-06-26 DIAGNOSIS — R293 Abnormal posture: Secondary | ICD-10-CM | POA: Diagnosis not present

## 2015-06-26 DIAGNOSIS — M542 Cervicalgia: Secondary | ICD-10-CM | POA: Diagnosis not present

## 2015-06-26 DIAGNOSIS — M5412 Radiculopathy, cervical region: Secondary | ICD-10-CM | POA: Diagnosis not present

## 2015-07-03 DIAGNOSIS — M6281 Muscle weakness (generalized): Secondary | ICD-10-CM | POA: Diagnosis not present

## 2015-07-03 DIAGNOSIS — M5412 Radiculopathy, cervical region: Secondary | ICD-10-CM | POA: Diagnosis not present

## 2015-07-03 DIAGNOSIS — R293 Abnormal posture: Secondary | ICD-10-CM | POA: Diagnosis not present

## 2015-07-03 DIAGNOSIS — M542 Cervicalgia: Secondary | ICD-10-CM | POA: Diagnosis not present

## 2015-07-08 ENCOUNTER — Other Ambulatory Visit: Payer: Self-pay | Admitting: Family Medicine

## 2015-07-08 MED ORDER — COLCHICINE 0.6 MG PO TABS
ORAL_TABLET | ORAL | Status: DC
Start: 2015-07-08 — End: 2015-07-09

## 2015-07-08 NOTE — Telephone Encounter (Signed)
Caller name: Relation to ZO:XWRU Call back [phone] [facility]: [facility]  Reason for call: pt is needing rx  colchicine 0.6 MG tablet  Pt states that her last rx she was only given 6 tabs wanted to know why. States she need 30 tab because she has to pay the same

## 2015-07-09 MED ORDER — COLCHICINE 0.6 MG PO TABS: ORAL_TABLET | ORAL | Status: AC

## 2015-07-09 NOTE — Telephone Encounter (Signed)
Sent in prescription as PCP instructed and patient informed

## 2015-07-09 NOTE — Addendum Note (Signed)
Addended by: Scharlene Gloss B on: 07/09/2015 01:57 PM   Modules accepted: Orders, Medications

## 2015-07-09 NOTE — Telephone Encounter (Signed)
OK to change her Colchicine 0.6 mg tabs prescription to read 2 tabs po prn acute pain and may repeat q 2 hours to a max of 6 tabs in 24 hours, pain relief or intolerable diarrhea. Then 1 tab po daily after that as needed. Disp #35 with 1 rf

## 2015-07-09 NOTE — Telephone Encounter (Signed)
This patient is requesting a refill on colchicine #30 she states received only #6 previously

## 2015-07-10 DIAGNOSIS — R293 Abnormal posture: Secondary | ICD-10-CM | POA: Diagnosis not present

## 2015-07-10 DIAGNOSIS — M5412 Radiculopathy, cervical region: Secondary | ICD-10-CM | POA: Diagnosis not present

## 2015-07-10 DIAGNOSIS — M6281 Muscle weakness (generalized): Secondary | ICD-10-CM | POA: Diagnosis not present

## 2015-07-10 DIAGNOSIS — M542 Cervicalgia: Secondary | ICD-10-CM | POA: Diagnosis not present

## 2015-07-29 IMAGING — US US CAROTID DUPLEX BILAT
1 series · 13 of 24 positions shown · non-contrast
Comparison: Cervical spine radiographs on 06/10/2015

CLINICAL DATA: Hypertension, hyperlipidemia and diabetes.
Suggestion of calcified plaque at the carotid bifurcations by recent
cervical spine x-rays.

EXAM:
BILATERAL CAROTID DUPLEX ULTRASOUND
TECHNIQUE: Gray scale imaging, color Doppler and duplex ultrasound were
performed of bilateral carotid and vertebral arteries in the neck.

[Series 1: us carotid duplex bilat · 0.06mm/px · 13 of 87 slices shown]
[im 1/87]
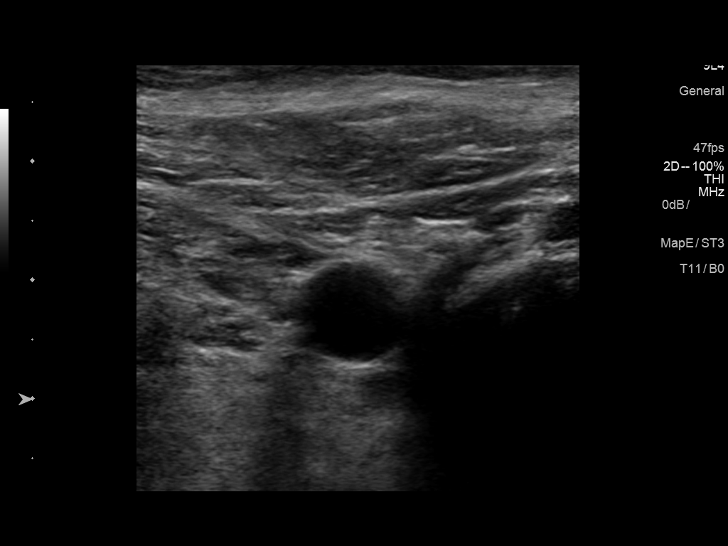
[im 8/87]
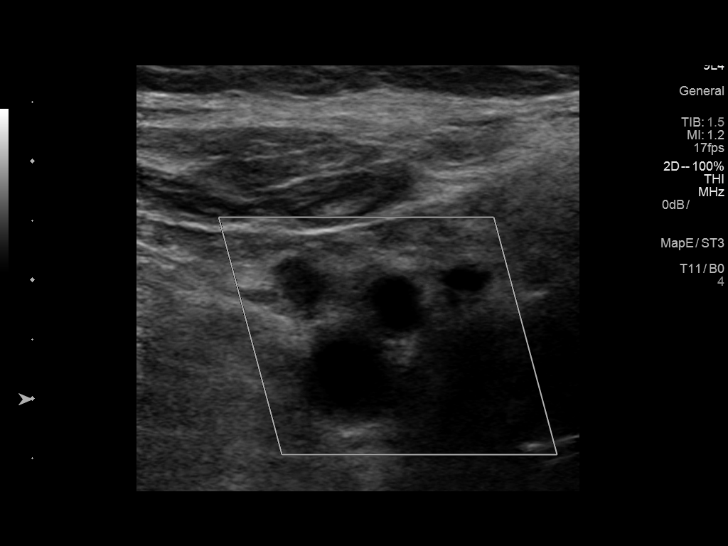
[im 15/87]
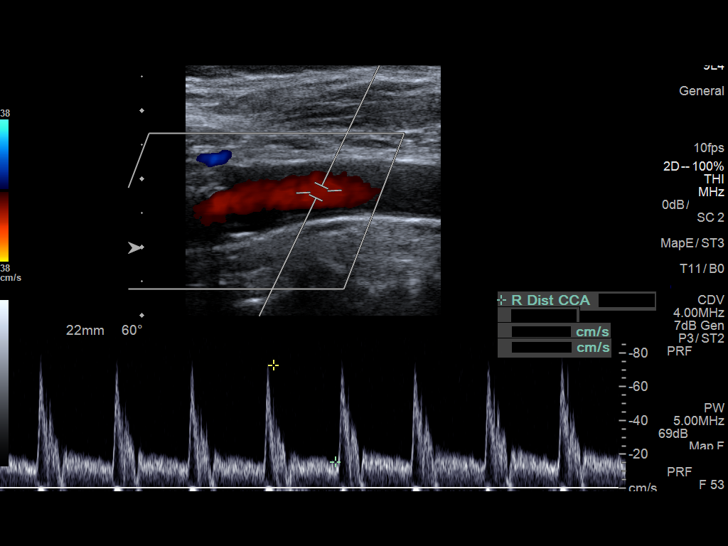
[im 23/87]
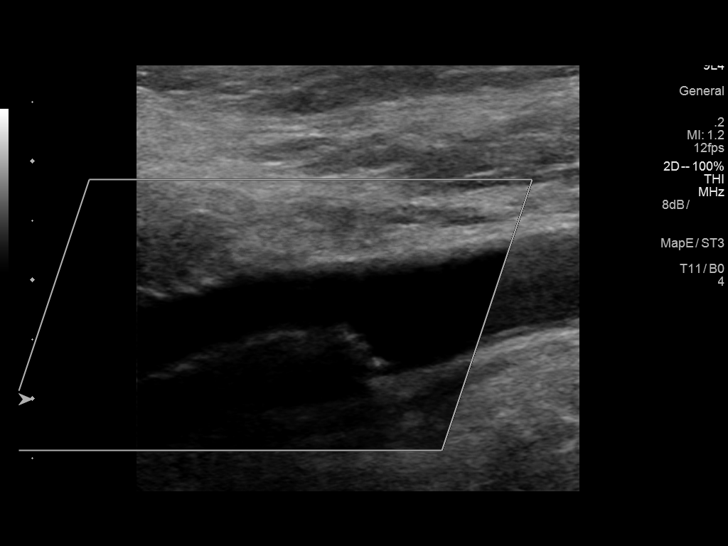
[im 30/87]
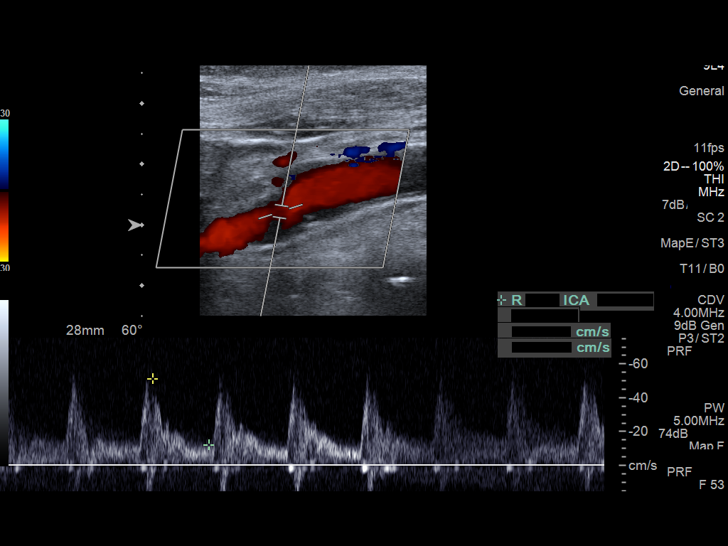
[im 38/87]
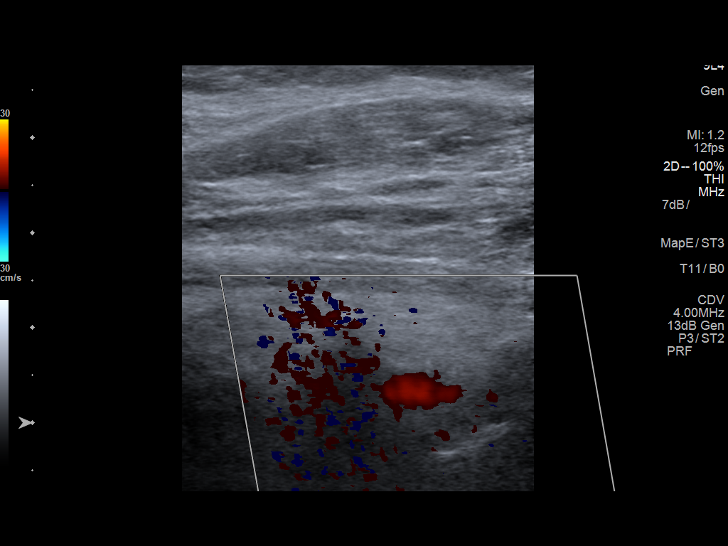
[im 45/87]
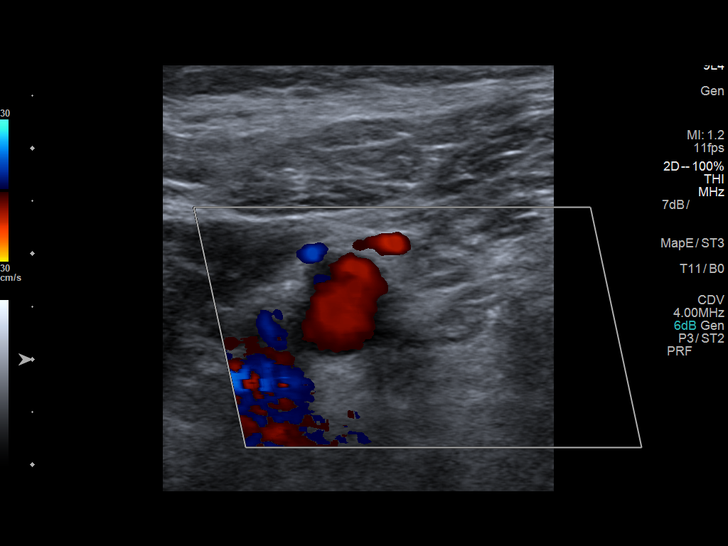
[im 49/87]
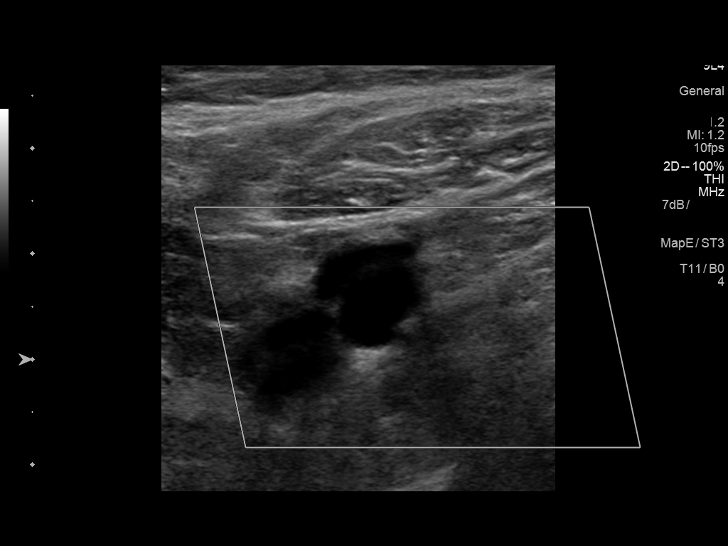
[im 57/87]
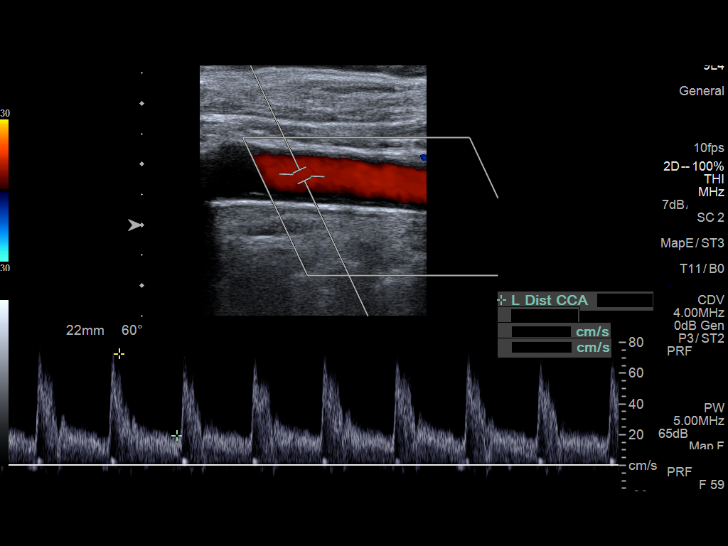
[im 64/87]
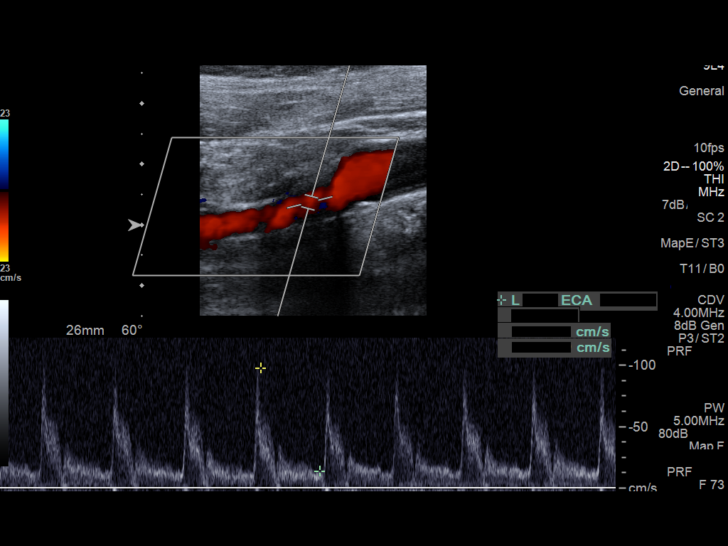
[im 72/87]
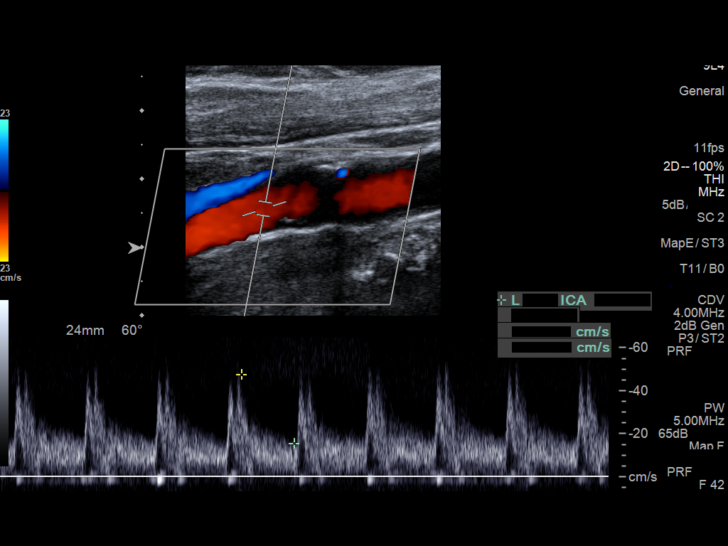
[im 79/87]
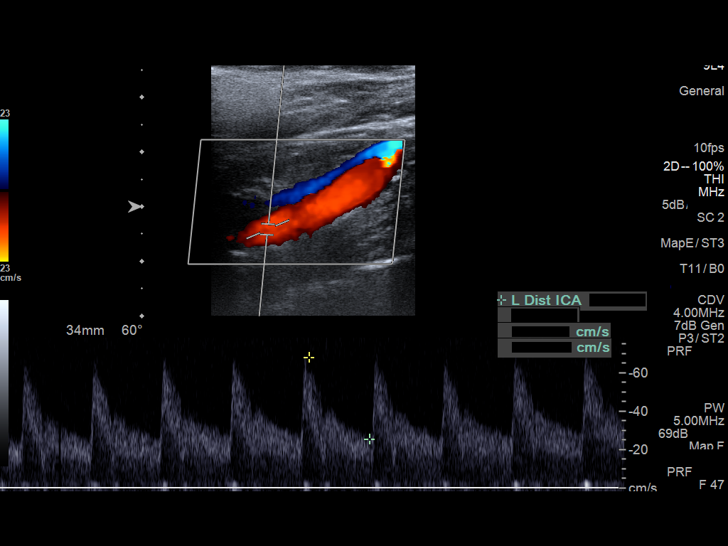
[im 87/87]
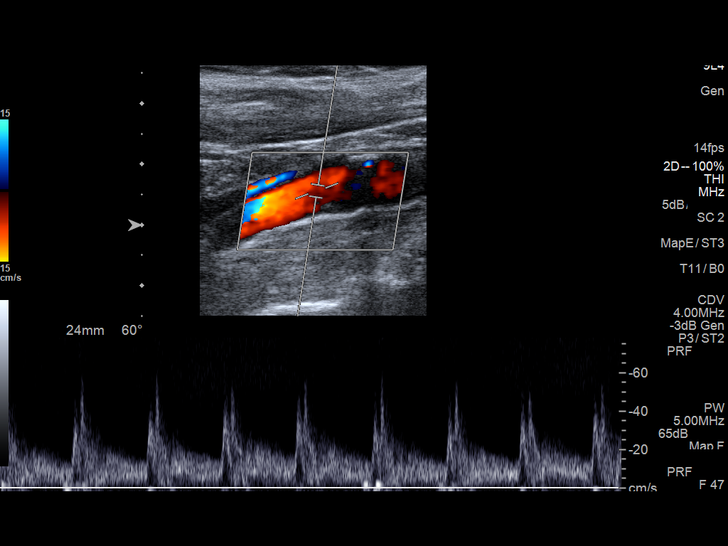

[13 of 24 positions shown; findings below may reference images not displayed]

FINDINGS: Criteria: Quantification of carotid stenosis is based on velocity
parameters that correlate the residual internal carotid diameter
with NASCET-based stenosis levels, using the diameter of the distal
internal carotid lumen as the denominator for stenosis measurement.

The following velocity measurements were obtained:

RIGHT

ICA:  77/26 cm/sec

CCA:  88/16 cm/sec

SYSTOLIC ICA/CCA RATIO:

DIASTOLIC ICA/CCA RATIO:

ECA:  117 cm/sec

LEFT

ICA:  68/26 cm/sec

CCA:  104/21 cm/sec

SYSTOLIC ICA/CCA RATIO:

DIASTOLIC ICA/CCA RATIO:

ECA:  98 cm/sec

RIGHT CAROTID ARTERY: There is a mild amount of calcified plaque at
the level of the carotid bulb and at the origin of the internal
carotid artery. Velocities and waveforms are normal and estimated
right ICA stenosis is less than 50%.

RIGHT VERTEBRAL ARTERY: Antegrade flow with normal waveform and
velocity.

LEFT CAROTID ARTERY: Mild to moderate amount of focal calcified
plaque is present at the level of the left carotid bulb and proximal
ICA. Velocities and waveforms are normal and estimated left ICA
stenosis is less than 50%.

LEFT VERTEBRAL ARTERY: Antegrade flow with normal waveform and
velocity.
IMPRESSION: Mild amount of calcified plaque at the level of the right carotid
bulb and ICA. Mild to moderate calcified plaque at the level of the
left carotid bulb and ICA. Estimated bilateral ICA stenoses are less
than 50%.

## 2015-07-30 ENCOUNTER — Other Ambulatory Visit: Payer: Self-pay | Admitting: *Deleted

## 2015-07-30 DIAGNOSIS — Z961 Presence of intraocular lens: Secondary | ICD-10-CM | POA: Diagnosis not present

## 2015-07-30 MED ORDER — METFORMIN HCL 1000 MG PO TABS: 1000.0000 mg | ORAL_TABLET | Freq: Two times a day (BID) | ORAL | Status: DC

## 2015-08-13 ENCOUNTER — Other Ambulatory Visit: Payer: Self-pay | Admitting: Family Medicine

## 2015-08-13 MED ORDER — ALLOPURINOL 100 MG PO TABS: 100.0000 mg | ORAL_TABLET | Freq: Every day | ORAL | Status: DC

## 2015-08-19 ENCOUNTER — Other Ambulatory Visit (INDEPENDENT_AMBULATORY_CARE_PROVIDER_SITE_OTHER): Payer: Medicare Other

## 2015-08-19 DIAGNOSIS — E119 Type 2 diabetes mellitus without complications: Secondary | ICD-10-CM

## 2015-08-19 DIAGNOSIS — IMO0002 Reserved for concepts with insufficient information to code with codable children: Secondary | ICD-10-CM

## 2015-08-19 DIAGNOSIS — E1165 Type 2 diabetes mellitus with hyperglycemia: Secondary | ICD-10-CM | POA: Diagnosis not present

## 2015-08-19 LAB — MICROALBUMIN / CREATININE URINE RATIO
Creatinine,U: 178.9 mg/dL
MICROALB UR: 1.7 mg/dL (ref 0.0–1.9)
Microalb Creat Ratio: 1 mg/g (ref 0.0–30.0)

## 2015-08-19 LAB — COMPREHENSIVE METABOLIC PANEL
ALBUMIN: 4 g/dL (ref 3.5–5.2)
ALK PHOS: 44 U/L (ref 39–117)
ALT: 35 U/L (ref 0–35)
AST: 20 U/L (ref 0–37)
BILIRUBIN TOTAL: 0.6 mg/dL (ref 0.2–1.2)
BUN: 17 mg/dL (ref 6–23)
CO2: 29 mEq/L (ref 19–32)
CREATININE: 0.78 mg/dL (ref 0.40–1.20)
Calcium: 9.5 mg/dL (ref 8.4–10.5)
Chloride: 103 mEq/L (ref 96–112)
GFR: 78.23 mL/min (ref >60.00)
GLUCOSE: 168 mg/dL — AB (ref 70–99)
Potassium: 4.6 mEq/L (ref 3.5–5.1)
SODIUM: 139 meq/L (ref 135–145)
TOTAL PROTEIN: 6.8 g/dL (ref 6.0–8.3)

## 2015-08-19 LAB — HEMOGLOBIN A1C: HEMOGLOBIN A1C: 7.4 % — AB (ref 4.6–6.5)

## 2015-08-21 ENCOUNTER — Ambulatory Visit (INDEPENDENT_AMBULATORY_CARE_PROVIDER_SITE_OTHER): Payer: Medicare Other | Admitting: Physician Assistant

## 2015-08-21 ENCOUNTER — Ambulatory Visit: Payer: Medicare Other | Admitting: Physician Assistant

## 2015-08-21 ENCOUNTER — Encounter: Payer: Self-pay | Admitting: Physician Assistant

## 2015-08-21 VITALS — BP 146/75 | HR 75 | Temp 99.0°F | Resp 16 | Ht 71.0 in

## 2015-08-21 DIAGNOSIS — R195 Other fecal abnormalities: Secondary | ICD-10-CM | POA: Diagnosis not present

## 2015-08-21 NOTE — Patient Instructions (Signed)
Please stay hydrated and continue your fiber supplement and probiotic. Please stop your daily stool softener while you are having these loose stools. This will likely mostly resolve your symptoms itself. Please bring back in your stool samples for assessment in the lab.   I will call you with your results.  If symptoms worsen or are not resolving please call or come see [place] in office.

## 2015-08-21 NOTE — Progress Notes (Signed)
Pre visit review using our clinic review tool, if applicable. No additional management support is needed unless otherwise documented below in the visit note/SLS  

## 2015-08-22 ENCOUNTER — Ambulatory Visit (INDEPENDENT_AMBULATORY_CARE_PROVIDER_SITE_OTHER): Payer: Medicare Other | Admitting: Endocrinology

## 2015-08-22 ENCOUNTER — Encounter: Payer: Self-pay | Admitting: Endocrinology

## 2015-08-22 ENCOUNTER — Other Ambulatory Visit: Payer: Medicare Other

## 2015-08-22 VITALS — BP 122/70 | HR 70 | Temp 98.2°F | Resp 16 | Ht 70.0 in

## 2015-08-22 DIAGNOSIS — Z23 Encounter for immunization: Secondary | ICD-10-CM | POA: Diagnosis not present

## 2015-08-22 DIAGNOSIS — R195 Other fecal abnormalities: Secondary | ICD-10-CM | POA: Insufficient documentation

## 2015-08-22 DIAGNOSIS — E1165 Type 2 diabetes mellitus with hyperglycemia: Secondary | ICD-10-CM

## 2015-08-22 DIAGNOSIS — IMO0002 Reserved for concepts with insufficient information to code with codable children: Secondary | ICD-10-CM

## 2015-08-22 DIAGNOSIS — E038 Other specified hypothyroidism: Secondary | ICD-10-CM | POA: Diagnosis not present

## 2015-08-22 MED ORDER — ALOGLIPTIN BENZOATE 25 MG PO TABS: ORAL_TABLET | ORAL | Status: DC

## 2015-08-22 NOTE — Patient Instructions (Signed)
Check blood sugars on waking up .Marland Kitchen 2-3 .Marland Kitchen times a week Also check blood sugars about 2 hours after a meal and do this after different meals by rotation  Recommended blood sugar levels on waking up is 90-130 and about 2 hours after meal is 140-180 Please bring blood sugar monitor to each visit.  With Nesina if sugars start getting low stop Amaryl

## 2015-08-22 NOTE — Assessment & Plan Note (Signed)
Suspect due to excess stool softener usage. History relatively unremarkable except for a couple of loose stools per day without other symptoms. Exam unremarkable. Will obtain stool studies. Stay hydrated. Continue fiber supplement. Resume Probiotic. Stop Stool softener. Follow-up if symptoms are not resolving. Will alter regimen if indicated by stool results.

## 2015-08-22 NOTE — Progress Notes (Signed)
Patient ID: Crystal Hodge, female   DOB: 1946/04/04, 69 y.o.   MRN: [id number]   Reason for Appointment: Diabetes follow-up   History of Present Illness    Diagnosis: Type 2 diabetes mellitus, date of diagnosis: 2008.   She has been on a regimen of Amaryl and metformin; was on Janumet but did not continue because of the cost especially in the donut hole Her A1c has been consistently just over 7% and now slightly better She has difficulty losing weight   She refuses to have her weight checked in the office, she thinks it is slightly better She has however been much more active with exercise  She takes Amaryl in the evening to avoid hypoglycemia at midday but she still needs to eat on time to prevent the feeling of low sugars She has relatively higher fasting readings on this visit, previously had not been checking her sugar months  Monitors blood glucose: Less than once a day and recent readings are ranging between  137-155 that only 3 readings Glucometer: Freestyle.  Hypoglycemia: None recently  Meals:  will have eggs or other protein with her breakfast   Exercise: At the gym with playing tennis type of game 3/7  that she is doing in [place]   Dietician visit: Most recent:5/11.   Complications: peripheral neuropathy, ? retinopathy.   Lab Results  Component Value Date   HGBA1C 7.4* 08/19/2015   HGBA1C 7.3* 05/03/2015   HGBA1C 7.5* 10/30/2014   Lab Results  Component Value Date   MICROALBUR 1.7 08/19/2015   LDLCALC 75 05/03/2015   CREATININE 0.78 08/19/2015      HYPERLIPIDEMIA:         The lipid abnormality consists of elevated LDL and low HDL. She has been compliant with her Lipitor 40 mg and this was increased on her previous visit with improvement Nonfasting triglycerides are high but relatively better   Lab Results  Component Value Date   CHOL 159 05/03/2015   HDL 34* 05/03/2015   LDLCALC 75 05/03/2015   LDLDIRECT 121.8 10/30/2014   TRIG 248*  05/03/2015   CHOLHDL 4.7 05/03/2015    HYPOTHYROIDISM: She is compliant with her thyroid supplement and has no unusual fatigue.  TSH is usually normal with 100 mcg levothyroxine  Lab Results  Component Value Date   TSH 2.377 05/03/2015      Appointment on 08/19/2015  Component Date Value Ref Range Status  . Hgb A1c MFr Bld 08/19/2015 7.4* 4.6 - 6.5 % Final   Glycemic Control Guidelines for People with Diabetes:Non Diabetic:  <6%Goal of Therapy: <7%Additional Action Suggested:  >8%   . Sodium 08/19/2015 139  135 - 145 mEq/L Final  . Potassium 08/19/2015 4.6  3.5 - 5.1 mEq/L Final  . Chloride 08/19/2015 103  96 - 112 mEq/L Final  . CO2 08/19/2015 29  19 - 32 mEq/L Final  . Glucose, Bld 08/19/2015 168* 70 - 99 mg/dL Final  . BUN 40/98/1191 17  6 - 23 mg/dL Final  . Creatinine, Ser 08/19/2015 0.78  0.40 - 1.20 mg/dL Final  . Total Bilirubin 08/19/2015 0.6  0.2 - 1.2 mg/dL Final  . Alkaline Phosphatase 08/19/2015 44  39 - 117 U/L Final  . AST 08/19/2015 20  0 - 37 U/L Final  . ALT 08/19/2015 35  0 - 35 U/L Final  . Total Protein 08/19/2015 6.8  6.0 - 8.3 g/dL Final  . Albumin 47/82/9562 4.0  3.5 - 5.2 g/dL Final  . Calcium 16/08/9603 9.5  8.4 - 10.5 mg/dL Final  . GFR 54/07/8118 78.23  >60.00 mL/min Final  . Microalb, Ur 08/19/2015 1.7  0.0 - 1.9 mg/dL Final  . Creatinine,U 14/78/2956 178.9   Final  . Microalb Creat Ratio 08/19/2015 1.0  0.0 - 30.0 mg/g Final      Medication List       This list is accurate as of: 08/22/15  9:21 AM.  Always use your most recent med list.               allopurinol 100 MG tablet  Commonly known as:  ZYLOPRIM  Take 1 tablet (100 mg total) by mouth daily.     Alogliptin Benzoate 25 MG Tabs  Commonly known as:  NESINA  1 daily     aspirin 81 MG tablet  Take 81 mg by mouth every morning.     atorvastatin 40 MG tablet  Commonly known as:  LIPITOR  Take 1 tablet (40 mg total) by mouth every evening.     benazepril 10 MG tablet    Commonly known as:  LOTENSIN  Take 1 tablet (10 mg total) by mouth daily.     Co Q 10 10 MG Caps  Take 100 mg by mouth every morning.     colchicine 0.6 MG tablet  Take 2 tablets by mouth as needed for acute pain, may repeat every 2 hours to a max 6 tablets in 24 hours. Then 1 tablet every day as needed     docusate sodium 100 MG capsule  Commonly known as:  COLACE  Take 100 mg by mouth 2 (two) times daily.     Fiber Caps  Take 1 capsule by mouth every morning.     glimepiride 1 MG tablet  Commonly known as:  AMARYL  Take 1 tablet (1 mg total) by mouth daily before supper.     glucose blood test strip  Commonly known as:  FREESTYLE LITE  Use as instructed to check blood sugars 2 times per day dx code 250.00     levothyroxine 100 MCG tablet  Commonly known as:  SYNTHROID, LEVOTHROID  Take 1 tablet (100 mcg total) by mouth daily.     LORazepam 0.5 MG tablet  Commonly known as:  ATIVAN  Take 1 tablet (0.5 mg total) by mouth 2 (two) times daily as needed for anxiety or sleep.     metFORMIN 1000 MG tablet  Commonly known as:  GLUCOPHAGE  Take 1 tablet (1,000 mg total) by mouth 2 (two) times daily.     NON FORMULARY  1 tsp honey with Manuka every morning.     triamterene-hydrochlorothiazide 37.5-25 MG tablet  Commonly known as:  MAXZIDE-25  Take 0.5 tablets by mouth daily.        Allergies:  Allergies  Allergen Reactions  . Amoxicillin Diarrhea  . Prednisone     Hallucinations- can't take oral    Past Medical History  Diagnosis Date  . Diabetes mellitus     type II  . Hypertension   . Hyperlipidemia   . Thyroid disease   . Hypothyroidism   . Rectocele   . Menopause   . Obesity   . Tremor 04/07/2011  . Staph infection 09/12/2013  . Rectocele 09/12/2013  . Preventative health care 09/16/2013  . Acute upper respiratory infections of unspecified site 09/16/2013  . Dyspnea 11/19/2013  . Cervical cancer screening 01/30/2014  . Sun-damaged skin 02/04/2014  .  Dermatitis 06/01/2014  . Medicare annual wellness visit, subsequent 09/16/2013    Has seen Dr Danella Deis of dermatology in past but presently struggling with insurance concerns may need referral to new derm, no concerns at present.   . Anxiety and depression 11/05/2014  . Abdominal pain, left lower quadrant 11/05/2014    Past Surgical History  Procedure Laterality Date  . Abdominal hysterectomy  07/28/95  . Carpal tunnel release  10/07    Right  . Eye surgery Bilateral 2016    cataracts    Family History  Problem Relation Age of Onset  . Hyperlipidemia Mother   . Mental illness Mother   . Dementia Mother   . Hyperlipidemia Father   . Heart disease Father     triple bypass  . Diabetes Son     ?type I  . Mental illness Son     bipolar  . Depression Maternal Aunt   . Stroke Maternal Grandmother   . Mental illness Son   . Mental illness Brother     bipolar    Social History:  reports that she quit smoking about 24 years ago. Her smoking use included Cigarettes. She has a 15 pack-year smoking history. She has never used smokeless tobacco. She reports that she drinks alcohol. She reports that she does not use illicit drugs.  Review of Systems   She has long-standing hypothyroidism, has no unusual fatigue and is compliant with her medication Currently on 100 g levothyroxine TSH is usually normal   Lab Results  Component Value Date   TSH 2.377 05/03/2015      HYPERTENSION:  blood pressure is well controlled, currently using Lotensin 10 mg and a half Maxzide  No history of numbness or tingling/burning in her feet, diabetic Foot exam done in 6/16    Examination:   BP 122/70 mmHg  Pulse 70  Temp(Src) 98.2 F (36.8 C)  Resp 16  Ht  (1.778 m)  Wt   SpO2 95%  There is no weight on file to calculate BMI.    no edema  Assesment/PLAN:  1. Diabetes type 2, uncontrolled   The patient's diabetes control is still inadequate and A1c is still over 7% See history of  present illness for detailed discussion of his current management, blood sugar patterns and problems identified She may be having some postprandial readings that are high which she is not monitoring   Her fasting glucoses have been high including on the lab  Although she is trying to exercise she is not always consistent on diet   She has been previously resisting trying to change in medications because of wanting to continue generics. However discussed that she will not be able to get adequate control with Amaryl and metformin especially since higher dose of Amaryl may cause tendency to hypoglycemia during the day   Will give her a trial of  Nesina 25 mg daily and may be able to taper off and stop Amaryl.   Have reminded her to check blood sugars more consistently and including some after meals which she is not doing.  2. Hypothyroidism: Adequately replaced on 100 g   3. Hypertension: Currently controlled  with Lotensin and Maxide, half of 25     Patient Instructions  Check blood sugars on waking up .Marland Kitchen 2-3 .Marland Kitchen times a week Also check blood sugars about 2 hours after a meal and do this after different meals by rotation  Recommended blood sugar levels on waking up is 90-130 and  about 2 hours after meal is 140-180 Please bring blood sugar monitor to each visit.  With Nesina if sugars start getting low stop Amaryl        KUMAR,AJAY 08/22/2015, 9:21 AM

## 2015-08-22 NOTE — Progress Notes (Signed)
Patient presents to clinic today c/o 3 loose stools per day over the past 2 weeks. Denies abdominal pain, nausea/vomiting, fever, chills or malaise. Denies tenesmus, melena or hematochezia. Denies recent travel or sick contact. Denies abnormal food or water source. Usually has issue with constipation for which she takes a daily fiber supplement and stool softener twice daily. Has continued to take stool softener throughout the past 2 weeks.  Past Medical History  Diagnosis Date  . Diabetes mellitus     type II  . Hypertension   . Hyperlipidemia   . Thyroid disease   . Hypothyroidism   . Rectocele   . Menopause   . Obesity   . Tremor 04/07/2011  . Staph infection 09/12/2013  . Rectocele 09/12/2013  . Preventative health care 09/16/2013  . Acute upper respiratory infections of unspecified site 09/16/2013  . Dyspnea 11/19/2013  . Cervical cancer screening 01/30/2014  . Sun-damaged skin 02/04/2014  . Dermatitis 06/01/2014  . Medicare annual wellness visit, subsequent 09/16/2013    Has seen Dr Danella Deis of dermatology in past but presently struggling with insurance concerns may need referral to new derm, no concerns at present.   . Anxiety and depression 11/05/2014  . Abdominal pain, left lower quadrant 11/05/2014    Current Outpatient Prescriptions on File Prior to Visit  Medication Sig Dispense Refill  . allopurinol (ZYLOPRIM) 100 MG tablet Take 1 tablet (100 mg total) by mouth daily. 30 tablet 3  . aspirin 81 MG tablet Take 81 mg by mouth every morning.      Marland Kitchen atorvastatin (LIPITOR) 40 MG tablet Take 1 tablet (40 mg total) by mouth every evening. 90 tablet 3  . benazepril (LOTENSIN) 10 MG tablet Take 1 tablet (10 mg total) by mouth daily. 90 tablet 1  . Coenzyme Q10 (CO Q 10) 10 MG CAPS Take 100 mg by mouth every morning.      . colchicine 0.6 MG tablet Take 2 tablets by mouth as needed for acute pain, may repeat every 2 hours to a max 6 tablets in 24 hours. Then 1 tablet every day as  needed 35 tablet 1  . docusate sodium (COLACE) 100 MG capsule Take 100 mg by mouth 2 (two) times daily.      . Fiber CAPS Take 1 capsule by mouth every morning.      Marland Kitchen glimepiride (AMARYL) 1 MG tablet Take 1 tablet (1 mg total) by mouth daily before supper. 90 tablet 1  . glucose blood (FREESTYLE LITE) test strip Use as instructed to check blood sugars 2 times per day dx code 250.00 100 each 12  . levothyroxine (SYNTHROID, LEVOTHROID) 100 MCG tablet Take 1 tablet (100 mcg total) by mouth daily. 90 tablet 1  . LORazepam (ATIVAN) 0.5 MG tablet Take 1 tablet (0.5 mg total) by mouth 2 (two) times daily as needed for anxiety or sleep. 30 tablet 1  . metFORMIN (GLUCOPHAGE) 1000 MG tablet Take 1 tablet (1,000 mg total) by mouth 2 (two) times daily. 180 tablet 1  . NON FORMULARY 1 tsp honey with Manuka every morning.     . triamterene-hydrochlorothiazide (MAXZIDE-25) 37.5-25 MG per tablet Take 0.5 tablets by mouth daily. 45 tablet 3   No current facility-administered medications on file prior to visit.    Allergies  Allergen Reactions  . Amoxicillin Diarrhea  . Prednisone     Hallucinations- can't take oral    Family History  Problem Relation Age of Onset  . Hyperlipidemia Mother   .  Mental illness Mother   . Dementia Mother   . Hyperlipidemia Father   . Heart disease Father     triple bypass  . Diabetes Son     ?type I  . Mental illness Son     bipolar  . Depression Maternal Aunt   . Stroke Maternal Grandmother   . Mental illness Son   . Mental illness Brother     bipolar    Social History   Social History  . Marital Status: Widowed    Spouse Name: N/A  . Number of Children: 2  . Years of Education: HS   Occupational History  . Retired    Social History Main Topics  . Smoking status: Former Smoker -- 0.50 packs/day for 30 years    Types: Cigarettes    Quit date: 11/23/1990  . Smokeless tobacco: Never Used  . Alcohol Use: Yes     Comment: occasional  . Drug Use: No    . Sexual Activity:    Partners: Male   Other Topics Concern  . None   Social History Narrative   Caffeine: 1 mug coffee daily   Regular exercise: plays tennis.          Review of Systems - See HPI.  All other ROS are negative.  BP 146/75 mmHg  Pulse 75  Temp(Src) 99 F (37.2 C) (Oral)  Resp 16  Ht  (1.803 m)  Wt   SpO2 100%  Physical Exam  Constitutional: She is oriented to person, place, and time and well-developed, well-nourished, and in no distress.  HENT:  Head: Normocephalic and atraumatic.  Neck: Neck supple.  Cardiovascular: Normal rate, regular rhythm, normal heart sounds and intact distal pulses.   Pulmonary/Chest: Effort normal and breath sounds normal. No respiratory distress. She has no wheezes. She has no rales. She exhibits no tenderness.  Abdominal: Soft. Bowel sounds are normal. She exhibits no distension and no mass. There is no tenderness. There is no rebound and no guarding.  Neurological: She is alert and oriented to person, place, and time.  Skin: Skin is dry. No rash noted.  Psychiatric: Affect normal.  Vitals reviewed.   Recent Results (from the past 2160 hour(s))  Uric acid     Status: Abnormal   Collection Time: 06/06/15  4:50 PM  Result Value Ref Range   Uric Acid, Serum 7.4 (H) 2.4 - 7.0 mg/dL  Hemoglobin Z6X     Status: Abnormal   Collection Time: 08/19/15  9:07 AM  Result Value Ref Range   Hgb A1c MFr Bld 7.4 (H) 4.6 - 6.5 %    Comment: Glycemic Control Guidelines for People with Diabetes:Non Diabetic:  <6%Goal of Therapy: <7%Additional Action Suggested:  >8%   Comprehensive metabolic panel     Status: Abnormal   Collection Time: 08/19/15  9:07 AM  Result Value Ref Range   Sodium 139 135 - 145 mEq/L   Potassium 4.6 3.5 - 5.1 mEq/L   Chloride 103 96 - 112 mEq/L   CO2 29 19 - 32 mEq/L   Glucose, Bld 168 (H) 70 - 99 mg/dL   BUN 17 6 - 23 mg/dL   Creatinine, Ser 0.96 0.40 - 1.20 mg/dL   Total Bilirubin 0.6 0.2 - 1.2 mg/dL    Alkaline Phosphatase 44 39 - 117 U/L   AST 20 0 - 37 U/L   ALT 35 0 - 35 U/L   Total Protein 6.8 6.0 - 8.3 g/dL   Albumin 4.0 3.5 -  5.2 g/dL   Calcium 9.5 8.4 - 16.1 mg/dL   GFR 09.60 >45.40 mL/min  Microalbumin / creatinine urine ratio     Status: None   Collection Time: 08/19/15  9:07 AM  Result Value Ref Range   Microalb, Ur 1.7 0.0 - 1.9 mg/dL   Creatinine,U 981.1 mg/dL   Microalb Creat Ratio 1.0 0.0 - 30.0 mg/g    Assessment/Plan: Loose stools Suspect due to excess stool softener usage. History relatively unremarkable except for a couple of loose stools per day without other symptoms. Exam unremarkable. Will obtain stool studies. Stay hydrated. Continue fiber supplement. Resume Probiotic. Stop Stool softener. Follow-up if symptoms are not resolving. Will alter regimen if indicated by stool results.

## 2015-08-23 ENCOUNTER — Telehealth: Payer: Self-pay | Admitting: Endocrinology

## 2015-08-23 ENCOUNTER — Telehealth: Payer: Self-pay | Admitting: Family Medicine

## 2015-08-23 LAB — OVA AND PARASITE EXAMINATION: OP: NONE SEEN

## 2015-08-23 LAB — C. DIFFICILE GDH AND TOXIN A/B
C. difficile GDH: NOT DETECTED
C. difficile Toxin A/B: NOT DETECTED

## 2015-08-23 NOTE — Telephone Encounter (Signed)
alogliptine 25 mg is not covered by insurance alternate is [place] or onglyza-she is going to call around pharmacies to see what price there is 1st

## 2015-08-23 NOTE — Telephone Encounter (Signed)
Pt calling for results of stool sample. Please call home # [phone].

## 2015-08-23 NOTE — Telephone Encounter (Signed)
Please call in the alternate of januvia 25 mg to [facility] # [phone]

## 2015-08-23 NOTE — Telephone Encounter (Addendum)
Pt was calling because she continues to have episodes of loose stools and wanted to know results of stool tests.  She was informed that results were still pending.  Pt stated understanding.  She says that goes to [place] next Saturday and she wanted to know if we had any idea of what was going on with her.  She says that she continues to have multiples stools daily x 2 wks.  She has had 4 stools today.  Stools are described as brown and loose with a strong odor..  Denies presence of blood.   Message routed to Dr. Abner Greenspan

## 2015-08-25 NOTE — Telephone Encounter (Signed)
So her stool cultures only show yeast overgrowth. Minimize carbohydrate intake, increase exercise. Start Diflucan 150 mg tabs, 2 tabs po qd x 3 days then 1 tab po daily x 4 weeks. Disp #10 with 1 rf, can repeat once in 2 months if improves and then worsens again. If taking statin do not take on the days she takes the Diflucan. Also take  A good probiotic with numerous strains such as NOW 10 strain probiotic or [employer] or [facility] tabs daily. That will  Help. Seek care in [facility] if symptoms persist.

## 2015-08-26 ENCOUNTER — Other Ambulatory Visit: Payer: Self-pay | Admitting: *Deleted

## 2015-08-26 LAB — STOOL CULTURE

## 2015-08-26 MED ORDER — FLUCONAZOLE 150 MG PO TABS: ORAL_TABLET | ORAL | Status: AC

## 2015-08-26 MED ORDER — SITAGLIPTIN PHOSPHATE 100 MG PO TABS: 100.0000 mg | ORAL_TABLET | Freq: Every day | ORAL | Status: DC

## 2015-08-26 NOTE — Telephone Encounter (Signed)
Noted, rx sent to [facility] per patient request.

## 2015-08-26 NOTE — Telephone Encounter (Signed)
Patient informed of PCP instructions..  Printed prescription and faxed to [facility]  The patient verbally understood all instructions.

## 2015-08-26 NOTE — Telephone Encounter (Signed)
Called left message to call back 

## 2015-08-26 NOTE — Telephone Encounter (Signed)
Crystal Hodge, please see the notes below. I was not able to contact this patient on 08/23/2015.

## 2015-08-26 NOTE — Telephone Encounter (Signed)
Please see below and advise.

## 2015-08-26 NOTE — Telephone Encounter (Addendum)
Patient returning your call best # [phone] (patient states she has errands to run please leave a detail message)

## 2015-08-26 NOTE — Telephone Encounter (Signed)
Start Januvia 100 mg daily

## 2015-08-30 DIAGNOSIS — Z961 Presence of intraocular lens: Secondary | ICD-10-CM | POA: Diagnosis not present

## 2015-09-13 ENCOUNTER — Telehealth: Payer: Self-pay | Admitting: Endocrinology

## 2015-09-13 ENCOUNTER — Other Ambulatory Visit: Payer: Self-pay | Admitting: *Deleted

## 2015-09-13 MED ORDER — BENAZEPRIL HCL 10 MG PO TABS: 10.0000 mg | ORAL_TABLET | Freq: Every day | ORAL | Status: DC

## 2015-09-13 MED ORDER — LEVOTHYROXINE SODIUM 100 MCG PO TABS: 100.0000 ug | ORAL_TABLET | Freq: Every day | ORAL | Status: DC

## 2015-09-13 MED ORDER — GLIMEPIRIDE 1 MG PO TABS: 1.0000 mg | ORAL_TABLET | Freq: Every day | ORAL | Status: DC

## 2015-09-13 NOTE — Telephone Encounter (Signed)
Patient need refill of Glimepiride 1 mg [facility]  [facility] [address] [place] [facility] [place] [place] [facility] [id number] [phone] (Phone) [phone] (Fax)

## 2015-09-13 NOTE — Telephone Encounter (Signed)
rx sent

## 2015-09-13 NOTE — Telephone Encounter (Addendum)
Patient need a refill of Levothyroxine 100 mg,  Benazepril 10 mg, [facility]    [facility] [address] [place] [facility] [place] [place] [facility] [phone] (Phone) [phone] (Fax)       asap

## 2015-09-13 NOTE — Telephone Encounter (Signed)
Rx's have been sent. 

## 2015-10-04 ENCOUNTER — Other Ambulatory Visit: Payer: Self-pay | Admitting: *Deleted

## 2015-10-04 MED ORDER — SITAGLIPTIN PHOSPHATE 100 MG PO TABS: 100.0000 mg | ORAL_TABLET | Freq: Every day | ORAL | Status: DC

## 2015-10-28 ENCOUNTER — Other Ambulatory Visit: Payer: Self-pay | Admitting: Endocrinology

## 2015-11-12 ENCOUNTER — Ambulatory Visit (INDEPENDENT_AMBULATORY_CARE_PROVIDER_SITE_OTHER): Payer: Medicare Other | Admitting: Ophthalmology

## 2015-11-15 ENCOUNTER — Encounter: Payer: Medicare Other | Admitting: Family Medicine

## 2015-11-26 LAB — FECAL OCCULT BLOOD, GUAIAC: FECAL OCCULT BLD: NEGATIVE

## 2015-12-16 ENCOUNTER — Other Ambulatory Visit: Payer: Self-pay | Admitting: Endocrinology

## 2015-12-16 ENCOUNTER — Telehealth: Payer: Self-pay | Admitting: Endocrinology

## 2015-12-16 MED ORDER — LEVOTHYROXINE SODIUM 100 MCG PO TABS: 100.0000 ug | ORAL_TABLET | Freq: Every day | ORAL | Status: DC

## 2015-12-16 MED ORDER — ATORVASTATIN CALCIUM 40 MG PO TABS: 40.0000 mg | ORAL_TABLET | Freq: Every evening | ORAL | Status: DC

## 2015-12-16 MED ORDER — BENAZEPRIL HCL 10 MG PO TABS: 10.0000 mg | ORAL_TABLET | Freq: Every day | ORAL | Status: DC

## 2015-12-16 MED ORDER — GLIMEPIRIDE 1 MG PO TABS: 1.0000 mg | ORAL_TABLET | Freq: Every day | ORAL | Status: DC

## 2015-12-16 NOTE — Telephone Encounter (Signed)
Patient called stating that she is currently stuck in [place]   Rx:  Levothyroxine  Benazepril  Metformin Glimpiride  Atorvastatin   [facility] [place]!   Thank you

## 2015-12-16 NOTE — Telephone Encounter (Signed)
rxs have been sent.  

## 2015-12-31 ENCOUNTER — Telehealth: Payer: Self-pay | Admitting: Family Medicine

## 2015-12-31 NOTE — Telephone Encounter (Signed)
Received results from [facility] F[facility] Blood test was negative. Result has been abstracted into her chart. Called the patient on both cell/home number left a message to call back

## 2015-12-31 NOTE — Telephone Encounter (Signed)
Patient returned phone call and informed her of results.

## 2015-12-31 NOTE — Telephone Encounter (Signed)
Error

## 2016-02-04 DIAGNOSIS — J069 Acute upper respiratory infection, unspecified: Secondary | ICD-10-CM | POA: Diagnosis not present

## 2016-02-04 DIAGNOSIS — J029 Acute pharyngitis, unspecified: Secondary | ICD-10-CM | POA: Diagnosis not present

## 2016-02-13 DIAGNOSIS — J300 Vasomotor rhinitis: Secondary | ICD-10-CM | POA: Diagnosis not present

## 2016-02-13 DIAGNOSIS — J069 Acute upper respiratory infection, unspecified: Secondary | ICD-10-CM | POA: Diagnosis not present

## 2016-02-13 DIAGNOSIS — H6502 Acute serous otitis media, left ear: Secondary | ICD-10-CM | POA: Diagnosis not present

## 2016-02-18 DIAGNOSIS — H6502 Acute serous otitis media, left ear: Secondary | ICD-10-CM | POA: Diagnosis not present

## 2016-02-18 DIAGNOSIS — J209 Acute bronchitis, unspecified: Secondary | ICD-10-CM | POA: Diagnosis not present

## 2016-03-08 IMAGING — DX DG CERVICAL SPINE COMPLETE 4+V
6 series · 6 of 6 positions shown · non-contrast
Comparison: None.

CLINICAL DATA: Left-sided urine neck pain for 1 month. No known
injury.

EXAM:
CERVICAL SPINE  4+ VIEWS

[c-spine lat]
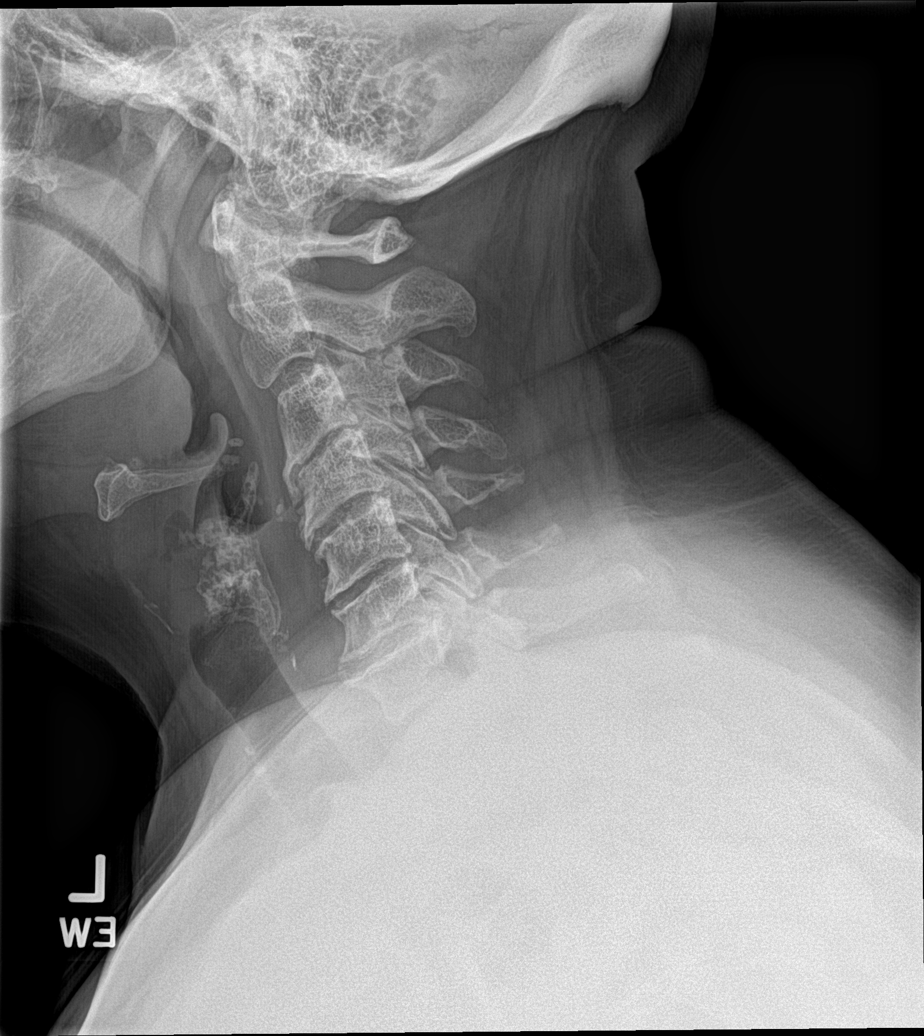

[c-spine obl (1 of 2)]
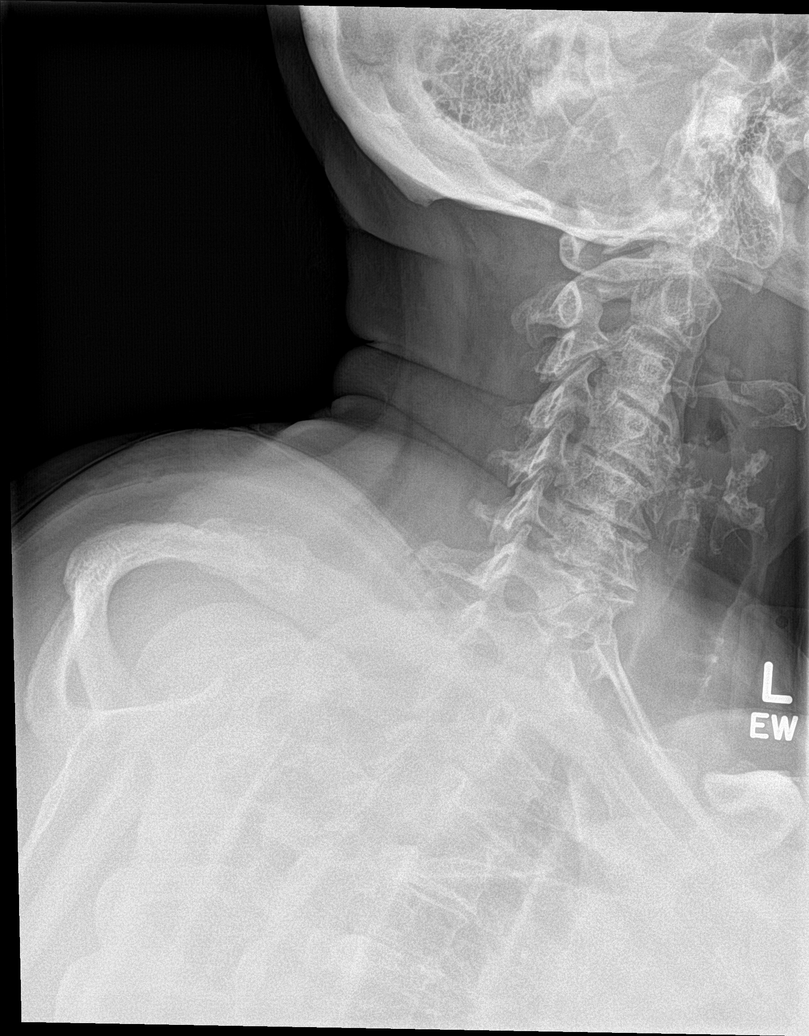

[c-spine obl (2 of 2)]
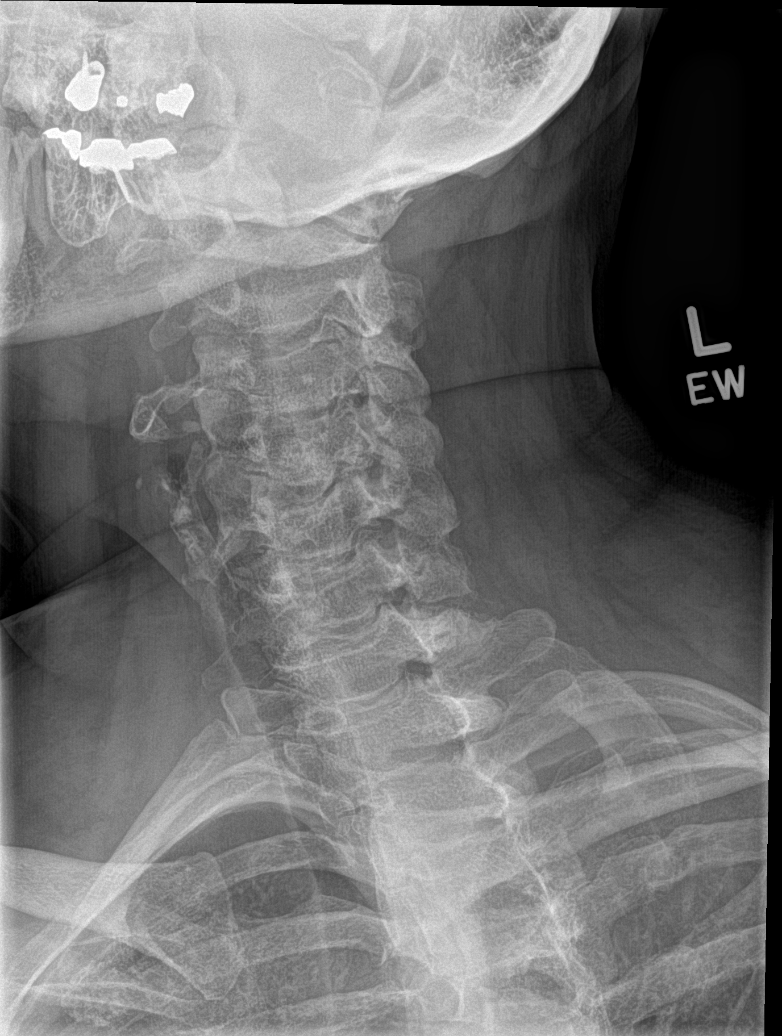

[c-spine ap]
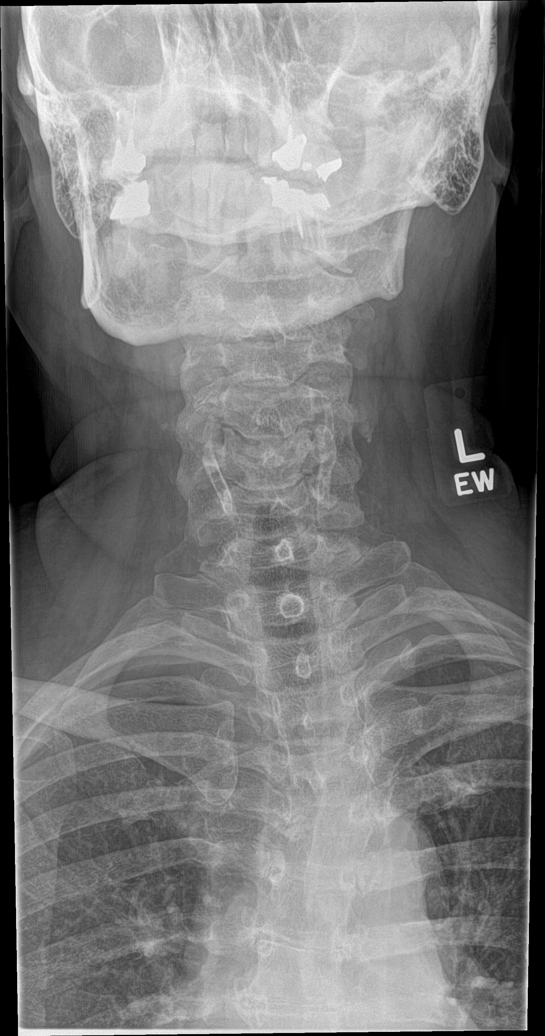

[c-spine open mouth]
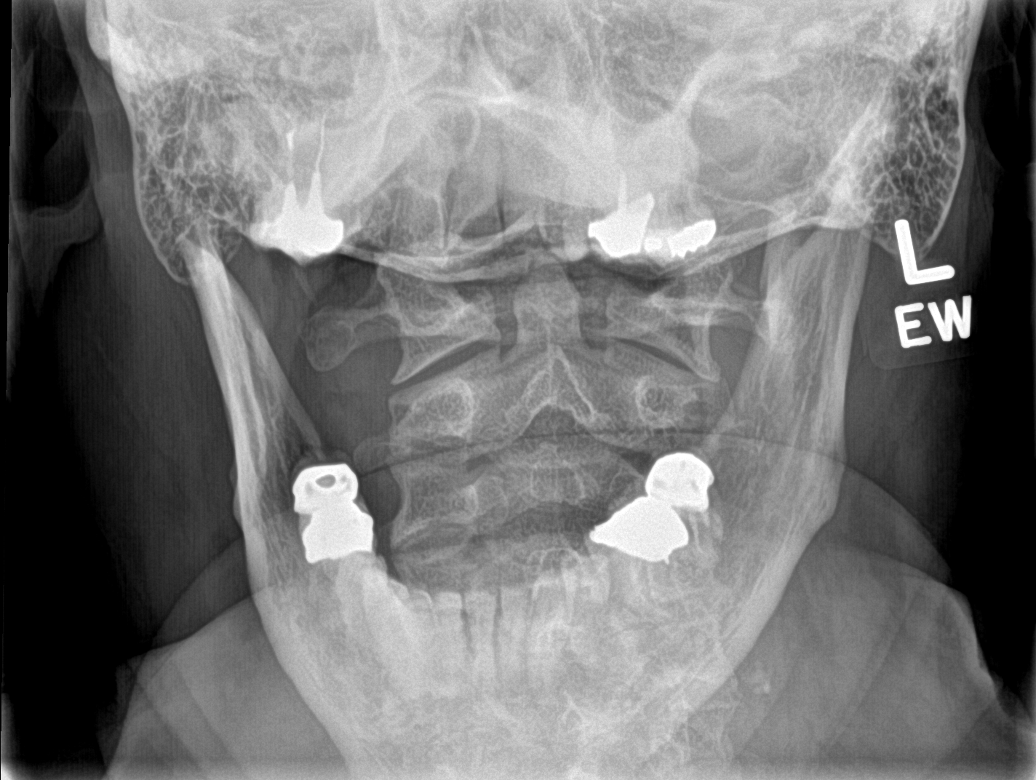

[c-spine swimmers]
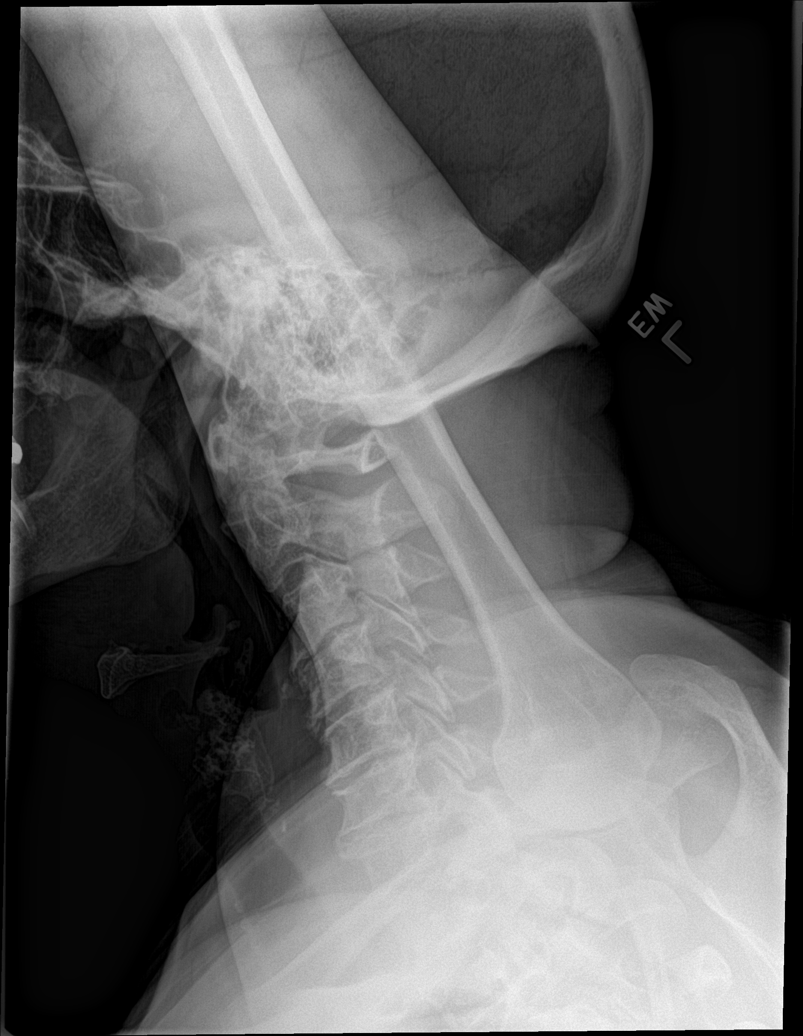

[6 of 6 positions shown; findings below may reference images not displayed]

FINDINGS: C1 to the inferior endplate of C7 is imaged on the provided lateral
radiograph. There is adequate evaluation of the cervical thoracic
junction on the provided swimmer's radiograph.

Normal alignment of the cervical spine. No anterolisthesis or
retrolisthesis. The bilateral facets appear normally aligned given
obliquity. The dens is normally positioned between the lateral
masses of C1.

Cervical vertebral body heights are preserved. Prevertebral soft
tissues are normal.

Moderate multilevel cervical spine DDD, worse at C3-C4 and C6-C7
with disc space height loss, endplate irregularity and sclerosis.

Calcified atherosclerotic plaque overlies expected location the
bilateral carotid bulbs, left greater than right. Regional soft
tissues appear otherwise normal. Limited visualization of lung
apices is normal.
IMPRESSION: 1. No acute findings.
2. Moderate multilevel cervical spine DDD, worse at C3-C4 and C6-C7.
3. Suspected calcified plaque within the bilateral carotid bulbs,
left greater than right. Further evaluation with carotid Doppler
ultrasound could be performed as clinically indicated.

## 2016-03-10 ENCOUNTER — Encounter: Payer: Medicare Other | Admitting: Family Medicine

## 2016-03-16 ENCOUNTER — Telehealth: Payer: Self-pay | Admitting: Endocrinology

## 2016-03-16 ENCOUNTER — Other Ambulatory Visit: Payer: Self-pay | Admitting: *Deleted

## 2016-03-16 ENCOUNTER — Other Ambulatory Visit: Payer: Self-pay | Admitting: Family Medicine

## 2016-03-16 MED ORDER — GLIMEPIRIDE 1 MG PO TABS: 1.0000 mg | ORAL_TABLET | Freq: Every day | ORAL | Status: AC

## 2016-03-16 MED ORDER — LEVOTHYROXINE SODIUM 100 MCG PO TABS: 100.0000 ug | ORAL_TABLET | Freq: Every day | ORAL | Status: DC

## 2016-03-16 MED ORDER — BENAZEPRIL HCL 10 MG PO TABS: 10.0000 mg | ORAL_TABLET | Freq: Every day | ORAL | Status: DC

## 2016-03-16 NOTE — Telephone Encounter (Signed)
rx sent

## 2016-03-16 NOTE — Telephone Encounter (Signed)
PT called and wanted to talk to you, she said she is still stuck in [place] from a death in the family and won't be able to be back in [place] until the end of June.  She said she needs her Glimepiride, Benazepril, and Levothyroxine refilled and [facility] [place] that she has on file.

## 2016-05-09 ENCOUNTER — Other Ambulatory Visit: Payer: Self-pay | Admitting: Family Medicine

## 2016-06-12 ENCOUNTER — Telehealth: Payer: Self-pay | Admitting: Endocrinology

## 2016-06-12 MED ORDER — ATORVASTATIN CALCIUM 40 MG PO TABS: 40.0000 mg | ORAL_TABLET | Freq: Every evening | ORAL | Status: DC

## 2016-06-12 MED ORDER — METFORMIN HCL 1000 MG PO TABS: 1000.0000 mg | ORAL_TABLET | Freq: Two times a day (BID) | ORAL | Status: DC

## 2016-06-12 MED ORDER — LEVOTHYROXINE SODIUM 100 MCG PO TABS: 100.0000 ug | ORAL_TABLET | Freq: Every day | ORAL | Status: DC

## 2016-06-12 NOTE — Telephone Encounter (Signed)
Rx sent through [facility]  

## 2016-06-12 NOTE — Addendum Note (Signed)
Addended by: Roena MaladyEVONTENNO, Antonette Hendricks Y on: 06/12/2016 02:13 PM   Modules accepted: Orders

## 2016-06-12 NOTE — Telephone Encounter (Signed)
[facility] lipitor, levothyroxine, and the metformin [facility]ills [phone] #

## 2016-07-11 ENCOUNTER — Other Ambulatory Visit: Payer: Self-pay | Admitting: Family Medicine

## 2016-08-03 ENCOUNTER — Encounter: Payer: Medicare Other | Admitting: Family Medicine

## 2016-08-19 ENCOUNTER — Telehealth: Payer: Self-pay | Admitting: Family Medicine

## 2016-08-19 ENCOUNTER — Other Ambulatory Visit: Payer: Self-pay | Admitting: Family Medicine

## 2016-08-19 DIAGNOSIS — Z1231 Encounter for screening mammogram for malignant neoplasm of breast: Secondary | ICD-10-CM

## 2016-08-19 NOTE — Telephone Encounter (Signed)
Patient just got back in to town and knows that she needs to be seen. She is requesting to have her CPE but the first availability is 12/14/16. She will be going back out of town 08/31/16. Would you like me to try and get her into a different spot within the next week? Please advise.   Patient Relation: Self Patient Phone: [phone]

## 2016-08-19 NOTE — Telephone Encounter (Signed)
I cannot do a CPE that fast but she can have a quick follow up to keep her care going, let me know if you still cannot find a spot

## 2016-08-20 ENCOUNTER — Other Ambulatory Visit (INDEPENDENT_AMBULATORY_CARE_PROVIDER_SITE_OTHER): Payer: Medicare Other

## 2016-08-20 ENCOUNTER — Other Ambulatory Visit: Payer: Self-pay | Admitting: Endocrinology

## 2016-08-20 DIAGNOSIS — E038 Other specified hypothyroidism: Secondary | ICD-10-CM

## 2016-08-20 DIAGNOSIS — IMO0002 Reserved for concepts with insufficient information to code with codable children: Secondary | ICD-10-CM

## 2016-08-20 DIAGNOSIS — E1165 Type 2 diabetes mellitus with hyperglycemia: Secondary | ICD-10-CM

## 2016-08-20 LAB — HEMOGLOBIN A1C: Hgb A1c MFr Bld: 7.7 % — ABNORMAL HIGH (ref 4.6–6.5)

## 2016-08-20 LAB — URINALYSIS, ROUTINE W REFLEX MICROSCOPIC
BILIRUBIN URINE: NEGATIVE
HGB URINE DIPSTICK: NEGATIVE
KETONES UR: NEGATIVE
Leukocytes, UA: NEGATIVE
NITRITE: NEGATIVE
RBC / HPF: NONE SEEN (ref 0–?)
SPECIFIC GRAVITY, URINE: 1.02 (ref 1.000–1.030)
Total Protein, Urine: NEGATIVE
URINE GLUCOSE: NEGATIVE
UROBILINOGEN UA: 0.2 (ref 0.0–1.0)
pH: 5.5 (ref 5.0–8.0)

## 2016-08-20 LAB — MICROALBUMIN / CREATININE URINE RATIO
Creatinine,U: 106.2 mg/dL
MICROALB UR: 1.2 mg/dL (ref 0.0–1.9)
Microalb Creat Ratio: 1.1 mg/g (ref 0.0–30.0)

## 2016-08-20 LAB — TSH: TSH: 4.08 u[IU]/mL (ref 0.35–4.50)

## 2016-08-20 LAB — BASIC METABOLIC PANEL
BUN: 17 mg/dL (ref 6–23)
CO2: 30 meq/L (ref 19–32)
CREATININE: 0.84 mg/dL (ref 0.40–1.20)
Calcium: 9.4 mg/dL (ref 8.4–10.5)
Chloride: 106 mEq/L (ref 96–112)
GFR: 71.6 mL/min (ref >60.00)
Glucose, Bld: 158 mg/dL — ABNORMAL HIGH (ref 70–99)
Potassium: 5 mEq/L (ref 3.5–5.1)
Sodium: 141 mEq/L (ref 135–145)

## 2016-08-20 NOTE — Telephone Encounter (Signed)
I am willing to come in early at 7:30 on 10/5 or 10/18 if she needs it sooner. Otherwise the 10/24 visit is ok with me

## 2016-08-20 NOTE — Telephone Encounter (Signed)
The earliest appointment you have available is a SAME DAY on 09/15/16 at 4:30pm. I put her in that spot for now to hold it. I can always call and reschedule or cancel if you would like me to. Patient stated that she might be out of town by then though.

## 2016-08-20 NOTE — Telephone Encounter (Signed)
Patient will come in at 7:30 on 08/27/16. She greatly appreciates you working with her. She is scheduled.

## 2016-08-21 ENCOUNTER — Ambulatory Visit (HOSPITAL_BASED_OUTPATIENT_CLINIC_OR_DEPARTMENT_OTHER): Payer: Medicare Other

## 2016-08-21 ENCOUNTER — Ambulatory Visit (HOSPITAL_BASED_OUTPATIENT_CLINIC_OR_DEPARTMENT_OTHER)
Admission: RE | Admit: 2016-08-21 | Discharge: 2016-08-21 | Disposition: A | Discharge status: Home / Self Care | Payer: Medicare Other | Source: Ambulatory Visit | Attending: Family Medicine | Admitting: Family Medicine

## 2016-08-21 DIAGNOSIS — Z1231 Encounter for screening mammogram for malignant neoplasm of breast: Secondary | ICD-10-CM | POA: Insufficient documentation

## 2016-08-24 ENCOUNTER — Encounter: Payer: Self-pay | Admitting: Endocrinology

## 2016-08-24 ENCOUNTER — Ambulatory Visit (INDEPENDENT_AMBULATORY_CARE_PROVIDER_SITE_OTHER): Payer: Medicare Other | Admitting: Endocrinology

## 2016-08-24 VITALS — BP 129/68 | HR 76 | Ht 70.0 in

## 2016-08-24 DIAGNOSIS — I10 Essential (primary) hypertension: Secondary | ICD-10-CM | POA: Diagnosis not present

## 2016-08-24 DIAGNOSIS — Z23 Encounter for immunization: Secondary | ICD-10-CM | POA: Diagnosis not present

## 2016-08-24 DIAGNOSIS — E038 Other specified hypothyroidism: Secondary | ICD-10-CM

## 2016-08-24 DIAGNOSIS — E1165 Type 2 diabetes mellitus with hyperglycemia: Secondary | ICD-10-CM | POA: Diagnosis not present

## 2016-08-24 DIAGNOSIS — E782 Mixed hyperlipidemia: Secondary | ICD-10-CM

## 2016-08-24 MED ORDER — LORAZEPAM 0.5 MG PO TABS: 0.5000 mg | ORAL_TABLET | Freq: Two times a day (BID) | ORAL | 0 refills | Status: AC | PRN

## 2016-08-24 MED ORDER — SITAGLIPTIN PHOSPHATE 100 MG PO TABS: 100.0000 mg | ORAL_TABLET | Freq: Every day | ORAL | 1 refills | Status: AC

## 2016-08-24 NOTE — Progress Notes (Signed)
Patient ID: Crystal Hodge, female   DOB: 04/26/1946, 70 y.o.   MRN: [id number]   Reason for Appointment: Diabetes follow-up   History of Present Illness    Diagnosis: Type 2 diabetes mellitus, date of diagnosis: 2008.   Oral hypoglycemic drugs: Metformin 1 g twice a day, Amaryl 1 mg at dinner  She has not been seen in follow-up for a year now  She has been on a regimen of Amaryl and metformin; was told to try Nesina last year as it was generic but she did not do so and does not know why. Again she does not want to take Januvia most of the time because of the cost  Her A1c has been consistently just over 7% and now relatively higher She refuses to have her weight checked in the office, she thinks she may have lost some weight She has usually been fairly active when she is in [place] However she does not think she can watch her diet consistently because of craving carbohydrates  She takes Amaryl in the evening to avoid hypoglycemia during the day  She has relatively higher fasting reading on the lab but she thinks they are fairly good in the morning at home Again did not bring her monitor  Glucometer: Freestyle. Monitors blood glucose: Less than once a day   Recent fasting readings: 101-138   Hypoglycemia: None recently  Meals:  will have eggs or other protein with her breakfast   Exercise: At the gym with playing tennis type of game 3/7 that she is doing in [place]   Dietician visit: Most recent:5/11.   Complications: peripheral neuropathy, ? retinopathy.   Lab Results  Component Value Date   HGBA1C 7.7 (H) 08/20/2016   HGBA1C 7.4 (H) 08/19/2015   HGBA1C 7.3 (H) 05/03/2015   Lab Results  Component Value Date   MICROALBUR 1.2 08/20/2016   LDLCALC 75 05/03/2015   CREATININE 0.84 08/20/2016      HYPERLIPIDEMIA:         The lipid abnormality consists of elevated LDL and low HDL. She has been compliant with her Lipitor 40 mg and this was increased on  her previous visit with improvement Nonfasting triglycerides are high but relatively better   Lab Results  Component Value Date   CHOL 159 05/03/2015   HDL 34 (L) 05/03/2015   LDLCALC 75 05/03/2015   LDLDIRECT 121.8 10/30/2014   TRIG 248 (H) 05/03/2015   CHOLHDL 4.7 05/03/2015    HYPOTHYROIDISM: She is compliant with her thyroid supplement and has no unusual fatigue.  TSH is usually normal with 100 mcg levothyroxine  Lab Results  Component Value Date   TSH 4.08 08/20/2016      Lab on 08/20/2016  Component Date Value Ref Range Status  . Hgb A1c MFr Bld 08/20/2016 7.7* 4.6 - 6.5 % Final  . Sodium 08/20/2016 141  135 - 145 mEq/L Final  . Potassium 08/20/2016 5.0  3.5 - 5.1 mEq/L Final  . Chloride 08/20/2016 106  96 - 112 mEq/L Final  . CO2 08/20/2016 30  19 - 32 mEq/L Final  . Glucose, Bld 08/20/2016 158* 70 - 99 mg/dL Final  . BUN 29/56/213009/28/2017 17  6 - 23 mg/dL Final  . Creatinine, Ser 08/20/2016 0.84  0.40 - 1.20 mg/dL Final  . Calcium 86/57/846909/28/2017 9.4  8.4 - 10.5 mg/dL Final  . GFR 62/95/284109/28/2017 71.60  >60.00 mL/min Final  . TSH 08/20/2016 4.08  0.35 - 4.50 uIU/mL Final  . Microalb, Ur 08/20/2016 1.2  0.0 - 1.9 mg/dL Final  . Creatinine,U 78/29/5621 106.2  mg/dL Final  . Microalb Creat Ratio 08/20/2016 1.1  0.0 - 30.0 mg/g Final  . Color, Urine 08/20/2016 YELLOW  Yellow;Lt. Yellow Final  . APPearance 08/20/2016 CLEAR  Clear Final  . Specific Gravity, Urine 08/20/2016 1.020  1.000 - 1.030 Final  . pH 08/20/2016 5.5  5.0 - 8.0 Final  . Total Protein, Urine 08/20/2016 NEGATIVE  Negative Final  . Urine Glucose 08/20/2016 NEGATIVE  Negative Final  . Ketones, ur 08/20/2016 NEGATIVE  Negative Final  . Bilirubin Urine 08/20/2016 NEGATIVE  Negative Final  . Hgb urine dipstick 08/20/2016 NEGATIVE  Negative Final  . Urobilinogen, UA 08/20/2016 0.2  0.0 - 1.0 Final  . Leukocytes, UA 08/20/2016 NEGATIVE  Negative Final  . Nitrite 08/20/2016 NEGATIVE  Negative Final  . WBC, UA 08/20/2016  0-2/hpf  0-2/hpf Final  . RBC / HPF 08/20/2016 none seen  0-2/hpf Final  . Squamous Epithelial / LPF 08/20/2016 Few(5-10/hpf)* Rare(0-4/hpf) Final      Medication List       Accurate as of 08/24/16  5:14 PM. Always use your most recent med list.          allopurinol 100 MG tablet Commonly known as:  ZYLOPRIM TAKE ONE TABLET BY MOUTH DAILY   aspirin 81 MG tablet Take 81 mg by mouth every morning.   atorvastatin 40 MG tablet Commonly known as:  LIPITOR Take 1 tablet (40 mg total) by mouth every evening.   benazepril 10 MG tablet Commonly known as:  LOTENSIN Take 1 tablet (10 mg total) by mouth daily.   Co Q 10 10 MG Caps Take 100 mg by mouth every morning.   colchicine 0.6 MG tablet Take 2 tablets by mouth as needed for acute pain, may repeat every 2 hours to a max 6 tablets in 24 hours. Then 1 tablet every day as needed   docusate sodium 100 MG capsule Commonly known as:  COLACE Take 100 mg by mouth 2 (two) times daily.   Fiber Caps Take 1 capsule by mouth every morning.   fluconazole 150 MG tablet Commonly known as:  DIFLUCAN Take 2 tablets by mouth daily for 3 days.  Then 1 by mouth weekly for 4 weeks. Repeat in 2 months if improves and then worsens. Do not take statin on the day taking diflucan.   glimepiride 1 MG tablet Commonly known as:  AMARYL Take 1 tablet (1 mg total) by mouth daily before supper.   glucose blood test strip Commonly known as:  FREESTYLE LITE Use as instructed to check blood sugars 2 times per day dx code 250.00   levothyroxine 100 MCG tablet Commonly known as:  SYNTHROID, LEVOTHROID Take 1 tablet (100 mcg total) by mouth daily.   LORazepam 0.5 MG tablet Commonly known as:  ATIVAN Take 1 tablet (0.5 mg total) by mouth 2 (two) times daily as needed for anxiety or sleep.   metFORMIN 1000 MG tablet Commonly known as:  GLUCOPHAGE Take 1 tablet (1,000 mg total) by mouth 2 (two) times daily with a meal.   NON FORMULARY 1 tsp honey  with Manuka every morning.   sitaGLIPtin 100 MG tablet Commonly known as:  JANUVIA Take 1 tablet (100 mg total) by mouth daily.   triamterene-hydrochlorothiazide 37.5-25 MG tablet Commonly known as:  MAXZIDE-25 TAKE ONE-HALF TABLET BY MOUTH DAILY       Allergies:  Allergies  Allergen Reactions  . Amoxicillin Diarrhea  . Prednisone     Hallucinations- can't take oral    Past Medical History:  Diagnosis Date  . Abdominal pain, left lower quadrant 11/05/2014  . Acute upper respiratory infections of unspecified site 09/16/2013  . Anxiety and depression 11/05/2014  . Cervical cancer screening 01/30/2014  . Dermatitis 06/01/2014  . Diabetes mellitus    type II  . Dyspnea 11/19/2013  . Hyperlipidemia   . Hypertension   . Hypothyroidism   . Medicare annual wellness visit, subsequent 09/16/2013   Has seen Dr Danella Deis of dermatology in past but presently struggling with insurance concerns may need referral to new derm, no concerns at present.   . Menopause   . Obesity   . Preventative health care 09/16/2013  . Rectocele   . Rectocele 09/12/2013  . Staph infection 09/12/2013  . Sun-damaged skin 02/04/2014  . Thyroid disease   . Tremor 04/07/2011    Past Surgical History:  Procedure Laterality Date  . ABDOMINAL HYSTERECTOMY  07/28/95  . CARPAL TUNNEL RELEASE  10/07   Right  . EYE SURGERY Bilateral 2016   cataracts    Family History  Problem Relation Age of Onset  . Hyperlipidemia Mother   . Mental illness Mother   . Dementia Mother   . Hyperlipidemia Father   . Heart disease Father     triple bypass  . Diabetes Son     ?type I  . Mental illness Son     bipolar  . Depression Maternal Aunt   . Stroke Maternal Grandmother   . Mental illness Son   . Mental illness Brother     bipolar    Social History:  reports that she quit smoking about 25 years ago. Her smoking use included Cigarettes. She has a 15.00 pack-year smoking history. She has never used smokeless  tobacco. She reports that she drinks alcohol. She reports that she does not use drugs.  Review of Systems   She has long-standing hypothyroidism, has no unusual fatigue and is compliant with her medication Again on 100 g levothyroxine TSH is  normal   Lab Results  Component Value Date   TSH 4.08 08/20/2016      HYPERTENSION:  blood pressure is well controlled, She is using Lotensin 10 mg and a half Maxzide Electrolytes stable  No history of numbness or tingling/burning in her feet, diabetic Foot exam done in 10/17    Examination:   BP 129/68   Pulse 76   Ht 5\' 10"  (1.778 m)   There is no height or weight on file to calculate BMI.    no edema  Diabetic Foot Exam - Simple   Simple Foot Form Diabetic Foot exam was performed with the following findings:  Yes   Visual Inspection No deformities, no ulcerations, no other skin breakdown bilaterally:  Yes Sensation Testing Intact to touch and monofilament testing bilaterally:  Yes Pulse Check Posterior Tibialis and Dorsalis pulse intact bilaterally:  Yes Comments      Assesment/PLAN:  1. Diabetes type 2, Obese.   The patient's diabetes control is still inadequate and A1c is still over 7% She is coming back for follow-up after a year now  See history of present illness for detailed discussion of his current management, blood sugar patterns and problems identified  Again not clear when her sugars are high as she thinks her readings are fairly good at home, usually taking very sporadically because of needle phobia. Most  likely has postprandial hyperglycemia  She probably does better when she is able to follow her diet and exercise but also with adding medications like Januvia She has needle phobia and will not consider using any injectable drugs Discussed use of Trulicity with the self injector but she is very reluctant to consider this also  Although she is able exercise she is not always consistent on diet  She  agrees to go back on Januvia for now at least for 3 months May need to cut back or eliminate Amaryl if she has low normal blood sugars   Have reminded her to check blood sugars more consistently and including some after meals at various times  2. Hypothyroidism: Adequately replaced on 100 g and will continue the same dose  3. Hypertension: Currently controlled  with Lotensin and Maxzide, electrolytes and renal function stable  4.  Obesity: Encouraged her to start watching her carbohydrates better  5.  Anxiety and insomnia: She wants to get a prescription from me for Ativan, usually uses this when she cannot sleep Given 15 tablets today Advised her to get further prescriptions from PCP  Influenza vaccine given   Patient Instructions  Check blood sugars on waking up  2x per week  Also check blood sugars about 2 hours after a meal and do this after different meals by rotation  Recommended blood sugar levels on waking up is 90-130 and about 2 hours after meal is 130-160  Please bring your blood sugar monitor to each visit, thank you  If sugars < 80 reduce Glimeperide to 1/2    Counseling time on subjects discussed above is over 50% of today's 25 minute visit     Crystal Hodge 08/24/2016, 5:14 PM

## 2016-08-24 NOTE — Patient Instructions (Addendum)
Check blood sugars on waking up  2x per week  Also check blood sugars about 2 hours after a meal and do this after different meals by rotation  Recommended blood sugar levels on waking up is 90-130 and about 2 hours after meal is 130-160  Please bring your blood sugar monitor to each visit, thank you  If sugars < 80 reduce Glimeperide to 1/2

## 2016-08-27 ENCOUNTER — Encounter: Payer: Self-pay | Admitting: Family Medicine

## 2016-08-27 ENCOUNTER — Telehealth: Payer: Self-pay | Admitting: Family Medicine

## 2016-08-27 ENCOUNTER — Ambulatory Visit (INDEPENDENT_AMBULATORY_CARE_PROVIDER_SITE_OTHER): Payer: Medicare Other | Admitting: Family Medicine

## 2016-08-27 VITALS — BP 110/70 | HR 69 | Temp 98.3°F | Ht 70.0 in | Wt 230.0 lb

## 2016-08-27 DIAGNOSIS — H109 Unspecified conjunctivitis: Secondary | ICD-10-CM

## 2016-08-27 DIAGNOSIS — E1169 Type 2 diabetes mellitus with other specified complication: Secondary | ICD-10-CM | POA: Diagnosis not present

## 2016-08-27 DIAGNOSIS — E785 Hyperlipidemia, unspecified: Secondary | ICD-10-CM

## 2016-08-27 DIAGNOSIS — E039 Hypothyroidism, unspecified: Secondary | ICD-10-CM | POA: Diagnosis not present

## 2016-08-27 DIAGNOSIS — M109 Gout, unspecified: Secondary | ICD-10-CM | POA: Diagnosis not present

## 2016-08-27 DIAGNOSIS — Z23 Encounter for immunization: Secondary | ICD-10-CM

## 2016-08-27 DIAGNOSIS — I10 Essential (primary) hypertension: Secondary | ICD-10-CM | POA: Diagnosis not present

## 2016-08-27 DIAGNOSIS — E669 Obesity, unspecified: Secondary | ICD-10-CM | POA: Diagnosis not present

## 2016-08-27 DIAGNOSIS — L578 Other skin changes due to chronic exposure to nonionizing radiation: Secondary | ICD-10-CM

## 2016-08-27 DIAGNOSIS — H1031 Unspecified acute conjunctivitis, right eye: Secondary | ICD-10-CM

## 2016-08-27 DIAGNOSIS — Z0000 Encounter for general adult medical examination without abnormal findings: Secondary | ICD-10-CM | POA: Diagnosis not present

## 2016-08-27 DIAGNOSIS — L309 Dermatitis, unspecified: Secondary | ICD-10-CM

## 2016-08-27 DIAGNOSIS — E2839 Other primary ovarian failure: Secondary | ICD-10-CM

## 2016-08-27 HISTORY — DX: Unspecified conjunctivitis: H10.9

## 2016-08-27 LAB — LIPID PANEL
Cholesterol: 135 mg/dL (ref 0–200)
HDL: 38.5 mg/dL — AB (ref >39.00)
LDL CALC: 63 mg/dL (ref 0–99)
NONHDL: 96.64
Total CHOL/HDL Ratio: 4
Triglycerides: 167 mg/dL — ABNORMAL HIGH (ref 0.0–149.0)
VLDL: 33.4 mg/dL (ref 0.0–40.0)

## 2016-08-27 LAB — MICROALBUMIN / CREATININE URINE RATIO
Creatinine,U: 78.8 mg/dL
Microalb Creat Ratio: 0.9 mg/g (ref 0.0–30.0)

## 2016-08-27 LAB — TSH: TSH: 3.42 u[IU]/mL (ref 0.35–4.50)

## 2016-08-27 LAB — T4, FREE: Free T4: 0.95 ng/dL (ref 0.60–1.60)

## 2016-08-27 LAB — HEPATITIS C ANTIBODY: HCV AB: NEGATIVE

## 2016-08-27 LAB — URIC ACID: Uric Acid, Serum: 6.3 mg/dL (ref 2.4–7.0)

## 2016-08-27 MED ORDER — ALLOPURINOL 100 MG PO TABS: 100.0000 mg | ORAL_TABLET | Freq: Every day | ORAL | 2 refills | Status: AC

## 2016-08-27 MED ORDER — POLYMYXIN B-TRIMETHOPRIM 10000-0.1 UNIT/ML-% OP SOLN: 2.0000 [drp] | Freq: Four times a day (QID) | OPHTHALMIC | 0 refills | Status: AC

## 2016-08-27 NOTE — Assessment & Plan Note (Signed)
On Levothyroxine, continue to monitor 

## 2016-08-27 NOTE — Assessment & Plan Note (Signed)
Previously seen by Dr Danella DeisGruber, has a possible AK in mid back will refer back to that office

## 2016-08-27 NOTE — Assessment & Plan Note (Signed)
Tolerating statin, encouraged heart healthy diet, avoid trans fats, minimize simple carbs and saturated fats. Increase exercise as tolerated 

## 2016-08-27 NOTE — Telephone Encounter (Signed)
Relation to ZO:XWRUpt:self Call back [phone]   Reason for call:  Patient requesting bone density orders. Please advise

## 2016-08-27 NOTE — Assessment & Plan Note (Signed)
hgba1c acceptable, minimize simple carbs. Increase exercise as tolerated. Continue current meds 

## 2016-08-27 NOTE — Telephone Encounter (Signed)
Bone density ordered and patient aware.

## 2016-08-27 NOTE — Assessment & Plan Note (Signed)
Well controlled, no changes to meds. Encouraged heart healthy diet such as the DASH diet and exercise as tolerated.  °

## 2016-08-27 NOTE — Patient Instructions (Signed)

## 2016-08-27 NOTE — Progress Notes (Signed)
Pre visit review using our clinic review tool, if applicable. No additional management support is needed unless otherwise documented below in the visit note. 

## 2016-08-29 NOTE — Assessment & Plan Note (Signed)
Recurrent dermatitis behind ear. Refill given on steroid cream. .

## 2016-08-29 NOTE — Assessment & Plan Note (Signed)
Mild irritation of right eye at medial corner with skin irritation on upper lid. Is started on polytrim prn

## 2016-08-29 NOTE — Progress Notes (Signed)
Patient ID: Crystal Hodge, female   DOB: 1946-09-25, 70 y.o.   MRN: [id number]   Subjective:    Patient ID: Crystal Hodge, female    DOB: 1946-01-23, 70 y.o.   MRN: [id number]  Chief Complaint  Patient presents with  . Follow-up    HPI Patient is in today for follow up. She has been out of town this past year in [place] due to family obligations and the death of her son. She is stressed but managing well all things considered. She is noting some recurrent dermatitis behind ears recnetly and some scant discharge from righ eye and crusting on upper eyelid. No recent illness or recent hospitalization. She is planning to sell her house here and move permanently to [place]. Denies CP/palp/SOB/HA/congestion/fevers/GI or GU c/o. Taking meds as prescribed  Past Medical History:  Diagnosis Date  . Abdominal pain, left lower quadrant 11/05/2014  . Acute upper respiratory infections of unspecified site 09/16/2013  . Anxiety and depression 11/05/2014  . Cervical cancer screening 01/30/2014  . Conjunctivitis 08/27/2016  . Dermatitis 06/01/2014  . Diabetes mellitus    type II  . Dyspnea 11/19/2013  . Hyperlipidemia   . Hypertension   . Hypothyroidism   . Medicare annual wellness visit, subsequent 09/16/2013   Has seen Dr Danella Deis of dermatology in past but presently struggling with insurance concerns may need referral to new derm, no concerns at present.   . Menopause   . Obesity   . Preventative health care 09/16/2013  . Rectocele   . Rectocele 09/12/2013  . Staph infection 09/12/2013  . Sun-damaged skin 02/04/2014  . Thyroid disease   . Tremor 04/07/2011    Past Surgical History:  Procedure Laterality Date  . ABDOMINAL HYSTERECTOMY  07/28/95  . CARPAL TUNNEL RELEASE  10/07   Right  . EYE SURGERY Bilateral 2016   cataracts    Family History  Problem Relation Age of Onset  . Hyperlipidemia Mother   . Mental illness Mother   . Dementia Mother   . Hyperlipidemia Father   . Heart disease  Father     triple bypass  . Diabetes Son     ?type I  . Mental illness Son     bipolar  . Depression Maternal Aunt   . Stroke Maternal Grandmother   . Mental illness Son   . Mental illness Brother     bipolar    Social History   Social History  . Marital status: Widowed    Spouse name: N/A  . Number of children: 2  . Years of education: HS   Occupational History  . Retired    Social History Main Topics  . Smoking status: Former Smoker    Packs/day: 0.50    Years: 30.00    Types: Cigarettes    Quit date: 11/23/1990  . Smokeless tobacco: Never Used  . Alcohol use Yes     Comment: occasional  . Drug use: No  . Sexual activity: Not Currently    Partners: Male   Other Topics Concern  . Not on file   Social History Narrative   Caffeine: 1 mug coffee daily   Regular exercise: plays tennis.          Outpatient Medications Prior to Visit  Medication Sig Dispense Refill  . aspirin 81 MG tablet Take 81 mg by mouth every morning.      Marland Kitchen atorvastatin (LIPITOR) 40 MG tablet Take 1 tablet (40 mg total) by mouth every evening. 90  tablet 0  . benazepril (LOTENSIN) 10 MG tablet Take 1 tablet (10 mg total) by mouth daily. 90 tablet 0  . Coenzyme Q10 (CO Q 10) 10 MG CAPS Take 100 mg by mouth every morning.      . colchicine 0.6 MG tablet Take 2 tablets by mouth as needed for acute pain, may repeat every 2 hours to a max 6 tablets in 24 hours. Then 1 tablet every day as needed 35 tablet 1  . docusate sodium (COLACE) 100 MG capsule Take 100 mg by mouth 2 (two) times daily.      . Fiber CAPS Take 1 capsule by mouth every morning.      . fluconazole (DIFLUCAN) 150 MG tablet Take 2 tablets by mouth daily for 3 days.  Then 1 by mouth weekly for 4 weeks. Repeat in 2 months if improves and then worsens. Do not take statin on the day taking diflucan. 10 tablet 1  . glimepiride (AMARYL) 1 MG tablet Take 1 tablet (1 mg total) by mouth daily before supper. 90 tablet 0  . glucose blood  (FREESTYLE LITE) test strip Use as instructed to check blood sugars 2 times per day dx code 250.00 100 each 12  . levothyroxine (SYNTHROID, LEVOTHROID) 100 MCG tablet Take 1 tablet (100 mcg total) by mouth daily. 90 tablet 0  . LORazepam (ATIVAN) 0.5 MG tablet Take 1 tablet (0.5 mg total) by mouth 2 (two) times daily as needed for anxiety or sleep. 15 tablet 0  . metFORMIN (GLUCOPHAGE) 1000 MG tablet Take 1 tablet (1,000 mg total) by mouth 2 (two) times daily with a meal. 180 tablet 0  . NON FORMULARY 1 tsp honey with Manuka every morning.     . sitaGLIPtin (JANUVIA) 100 MG tablet Take 1 tablet (100 mg total) by mouth daily. 90 tablet 1  . triamterene-hydrochlorothiazide (MAXZIDE-25) 37.5-25 MG tablet TAKE ONE-HALF TABLET BY MOUTH DAILY 45 tablet 1  . allopurinol (ZYLOPRIM) 100 MG tablet TAKE ONE TABLET BY MOUTH DAILY 30 tablet 0   No facility-administered medications prior to visit.     Allergies  Allergen Reactions  . Amoxicillin Diarrhea  . Prednisone     Hallucinations- can't take oral    Review of Systems  Constitutional: Positive for malaise/fatigue. Negative for fever.  HENT: Negative for congestion.   Eyes: Negative for blurred vision.  Respiratory: Negative for shortness of breath.   Cardiovascular: Negative for chest pain, palpitations and leg swelling.  Gastrointestinal: Negative for abdominal pain, blood in stool and nausea.  Genitourinary: Negative for dysuria and frequency.  Musculoskeletal: Negative for falls.  Skin: Positive for itching and rash.  Neurological: Negative for dizziness, loss of consciousness and headaches.  Endo/Heme/Allergies: Negative for environmental allergies.  Psychiatric/Behavioral: Negative for depression. The patient is not nervous/anxious.        Objective:    Physical Exam  Constitutional: She is oriented to person, place, and time. She appears well-developed and well-nourished. No distress.  HENT:  Head: Normocephalic and atraumatic.    Nose: Nose normal.  Eyes: Right eye exhibits no discharge. Left eye exhibits no discharge.  Neck: Normal range of motion. Neck supple.  Cardiovascular: Normal rate and regular rhythm.   No murmur heard. Pulmonary/Chest: Effort normal and breath sounds normal.  Abdominal: Soft. Bowel sounds are normal. There is no tenderness.  Musculoskeletal: She exhibits no edema.  Neurological: She is alert and oriented to person, place, and time.  Skin: Skin is warm and dry. Rash noted.  Scaly patch on upper right eyelid  Psychiatric: She has a normal mood and affect.  Nursing note and vitals reviewed.   BP 110/70 (BP Location: Left Arm, Patient Position: Sitting, Cuff Size: Large)   Pulse 69   Temp 98.3 F (36.8 C) (Oral)   Ht 5\' 10"  (1.778 m)   Wt 230 lb (104.3 kg)   SpO2 97%   BMI 33.00 kg/m  Wt Readings from Last 3 Encounters:  08/27/16 230 lb (104.3 kg)  05/21/15 230 lb (104.3 kg)  10/30/14 232 lb 12.8 oz (105.6 kg)     Lab Results  Component Value Date   WBC 7.9 05/03/2015   HGB 12.9 05/03/2015   HCT 40.1 05/03/2015   PLT 300 05/03/2015   GLUCOSE 158 (H) 08/20/2016   CHOL 135 08/27/2016   TRIG 167.0 (H) 08/27/2016   HDL 38.50 (L) 08/27/2016   LDLDIRECT 121.8 10/30/2014   LDLCALC 63 08/27/2016   ALT 35 08/19/2015   AST 20 08/19/2015   NA 141 08/20/2016   K 5.0 08/20/2016   CL 106 08/20/2016   CREATININE 0.84 08/20/2016   BUN 17 08/20/2016   CO2 30 08/20/2016   TSH 3.42 08/27/2016   HGBA1C 7.7 (H) 08/20/2016   MICROALBUR <0.7 08/27/2016    Lab Results  Component Value Date   TSH 3.42 08/27/2016   Lab Results  Component Value Date   WBC 7.9 05/03/2015   HGB 12.9 05/03/2015   HCT 40.1 05/03/2015   MCV 78.8 05/03/2015   PLT 300 05/03/2015   Lab Results  Component Value Date   NA 141 08/20/2016   K 5.0 08/20/2016   CO2 30 08/20/2016   GLUCOSE 158 (H) 08/20/2016   BUN 17 08/20/2016   CREATININE 0.84 08/20/2016   BILITOT 0.6 08/19/2015   ALKPHOS 44  08/19/2015   AST 20 08/19/2015   ALT 35 08/19/2015   PROT 6.8 08/19/2015   ALBUMIN 4.0 08/19/2015   CALCIUM 9.4 08/20/2016   GFR 71.60 08/20/2016   Lab Results  Component Value Date   CHOL 135 08/27/2016   Lab Results  Component Value Date   HDL 38.50 (L) 08/27/2016   Lab Results  Component Value Date   LDLCALC 63 08/27/2016   Lab Results  Component Value Date   TRIG 167.0 (H) 08/27/2016   Lab Results  Component Value Date   CHOLHDL 4 08/27/2016   Lab Results  Component Value Date   HGBA1C 7.7 (H) 08/20/2016       Assessment & Plan:   Problem List Items Addressed This Visit    HTN (hypertension)    Well controlled, no changes to meds. Encouraged heart healthy diet such as the DASH diet and exercise as tolerated.       Hyperlipidemia    Tolerating statin, encouraged heart healthy diet, avoid trans fats, minimize simple carbs and saturated fats. Increase exercise as tolerated      Relevant Orders   Lipid panel (Completed)   Diabetes mellitus type 2 in obese (HCC)    hgba1c acceptable, minimize simple carbs. Increase exercise as tolerated. Continue current meds      Relevant Orders   Microalbumin / creatinine urine ratio (Completed)   Hypothyroidism    On Levothyroxine, continue to monitor      Relevant Orders   TSH (Completed)   T4, free (Completed)   Lipid panel (Completed)   Sun-damaged skin    Previously seen by Dr Danella Deis, has a possible AK in mid back will  refer back to that office      Relevant Orders   Ambulatory referral to Dermatology   Dermatitis    Recurrent dermatitis behind ear. Refill given on steroid cream. .      Conjunctivitis    Mild irritation of right eye at medial corner with skin irritation on upper lid. Is started on polytrim prn       Other Visit Diagnoses    Gout, unspecified cause, unspecified chronicity, unspecified site    -  Primary   Relevant Orders   Uric acid (Completed)   Preventative health care        Relevant Orders   Hepatitis C Antibody (Completed)   Need for vaccination with 13-polyvalent pneumococcal conjugate vaccine       Relevant Orders   Pneumococcal conjugate vaccine 13-valent (Completed)   Estrogen deficiency       Relevant Orders   DG Bone Density      I have changed Ms. Seats's allopurinol. I am also having her start on trimethoprim-polymyxin b. Additionally, I am having her maintain her aspirin, Co Q 10, Fiber, docusate sodium, NON FORMULARY, glucose blood, colchicine, fluconazole, triamterene-hydrochlorothiazide, benazepril, glimepiride, atorvastatin, metFORMIN, levothyroxine, sitaGLIPtin, and LORazepam.  Meds ordered this encounter  Medications  . allopurinol (ZYLOPRIM) 100 MG tablet    Sig: Take 1 tablet (100 mg total) by mouth daily.    Dispense:  90 tablet    Refill:  2  . trimethoprim-polymyxin b (POLYTRIM) ophthalmic solution    Sig: Place 2 drops into the right eye every 6 (six) hours.    Dispense:  10 mL    Refill:  0     Danise EdgeBLYTH, Lenay Lovejoy, MD

## 2016-08-31 ENCOUNTER — Other Ambulatory Visit: Payer: Self-pay | Admitting: Endocrinology

## 2016-09-02 DIAGNOSIS — Z961 Presence of intraocular lens: Secondary | ICD-10-CM | POA: Diagnosis not present

## 2016-09-02 DIAGNOSIS — H25813 Combined forms of age-related cataract, bilateral: Secondary | ICD-10-CM | POA: Diagnosis not present

## 2016-09-02 DIAGNOSIS — H35371 Puckering of macula, right eye: Secondary | ICD-10-CM | POA: Diagnosis not present

## 2016-09-02 DIAGNOSIS — E119 Type 2 diabetes mellitus without complications: Secondary | ICD-10-CM | POA: Diagnosis not present

## 2016-09-03 ENCOUNTER — Other Ambulatory Visit: Payer: Self-pay | Admitting: *Deleted

## 2016-09-03 MED ORDER — ATORVASTATIN CALCIUM 40 MG PO TABS: 40.0000 mg | ORAL_TABLET | Freq: Every evening | ORAL | 1 refills | Status: AC

## 2016-09-03 MED ORDER — BENAZEPRIL HCL 10 MG PO TABS: 10.0000 mg | ORAL_TABLET | Freq: Every day | ORAL | 1 refills | Status: AC

## 2016-09-09 ENCOUNTER — Other Ambulatory Visit: Payer: Self-pay | Admitting: Endocrinology

## 2016-09-14 ENCOUNTER — Ambulatory Visit (INDEPENDENT_AMBULATORY_CARE_PROVIDER_SITE_OTHER): Payer: Medicare Other

## 2016-09-14 ENCOUNTER — Ambulatory Visit (HOSPITAL_BASED_OUTPATIENT_CLINIC_OR_DEPARTMENT_OTHER)
Admission: RE | Admit: 2016-09-14 | Discharge: 2016-09-14 | Disposition: A | Discharge status: Home / Self Care | Payer: Medicare Other | Source: Ambulatory Visit | Attending: Family Medicine | Admitting: Family Medicine

## 2016-09-14 DIAGNOSIS — Z23 Encounter for immunization: Secondary | ICD-10-CM

## 2016-09-14 DIAGNOSIS — E119 Type 2 diabetes mellitus without complications: Secondary | ICD-10-CM | POA: Diagnosis not present

## 2016-09-14 DIAGNOSIS — Z87891 Personal history of nicotine dependence: Secondary | ICD-10-CM | POA: Insufficient documentation

## 2016-09-14 DIAGNOSIS — E2839 Other primary ovarian failure: Secondary | ICD-10-CM | POA: Insufficient documentation

## 2016-09-14 DIAGNOSIS — E039 Hypothyroidism, unspecified: Secondary | ICD-10-CM | POA: Insufficient documentation

## 2016-09-14 DIAGNOSIS — Z780 Asymptomatic menopausal state: Secondary | ICD-10-CM | POA: Insufficient documentation

## 2016-09-14 NOTE — Progress Notes (Signed)
Pre visit review using our clinic review tool, if applicable. No additional management support is needed unless otherwise documented below in the visit note.  Pt in clinic today requesting tetanus shot.  States she was advised to have tetanus shot done on the same day as her bone density.  Spoke with Dr. Abner GreenspanBlyth.  Verbal order given for patient to receive TD vaccine.    Vaccine given in right deltoid.  Pt tolerated injection well. No signs of a reaction noted upon leaving the clinic.

## 2016-09-15 ENCOUNTER — Ambulatory Visit: Payer: Medicare Other | Admitting: Family Medicine

## 2016-09-17 DIAGNOSIS — Z1211 Encounter for screening for malignant neoplasm of colon: Secondary | ICD-10-CM | POA: Diagnosis not present

## 2016-09-17 DIAGNOSIS — Z1212 Encounter for screening for malignant neoplasm of rectum: Secondary | ICD-10-CM | POA: Diagnosis not present

## 2016-09-17 LAB — COLOGUARD: Cologuard: NEGATIVE

## 2016-09-25 ENCOUNTER — Telehealth: Payer: Self-pay | Admitting: Family Medicine

## 2016-09-25 NOTE — Telephone Encounter (Signed)
Received Cologuard results--Negative abstracted into chart/copied to scan/mailed original to the patients home.

## 2016-09-30 ENCOUNTER — Telehealth: Payer: Self-pay | Admitting: Family Medicine

## 2016-09-30 NOTE — Telephone Encounter (Signed)
Cologuard is negative. Pt notified of results and verbalized understanding. Hard copy of results printed from portal and forwarded to PCP.

## 2016-09-30 NOTE — Telephone Encounter (Signed)
Pt called in to see if her Colo guard results are back in.   Please assist further.    Thanks.    CB: [phone]

## 2016-10-02 DIAGNOSIS — L989 Disorder of the skin and subcutaneous tissue, unspecified: Secondary | ICD-10-CM | POA: Diagnosis not present

## 2016-10-02 DIAGNOSIS — L299 Pruritus, unspecified: Secondary | ICD-10-CM | POA: Diagnosis not present

## 2016-11-19 ENCOUNTER — Telehealth: Payer: Self-pay | Admitting: Family Medicine

## 2016-11-19 NOTE — Telephone Encounter (Signed)
Patient states she advised PCP she's relocating therefore unable to schedule medicare wellness appointment.

## 2016-12-14 ENCOUNTER — Telehealth: Payer: Self-pay | Admitting: Family Medicine

## 2016-12-14 NOTE — Telephone Encounter (Signed)
Left patient a message to call back.

## 2016-12-14 NOTE — Telephone Encounter (Signed)
Patient called stating that she was billed on 09/14/16 for two different shots. She states that after speaking with her insurance company she is only supposed to be paying a copay. [facility] has [facility] billing department and they told her that she need to call our office. Please advise.   Phone: [phone]

## 2016-12-14 NOTE — Telephone Encounter (Signed)
Patient received bill for tetanus shot and administration fee copay is $45.00 [employer] medicare complete. I will have coding review.

## 2016-12-17 NOTE — Telephone Encounter (Signed)
Left patient a message insurance not covering will only cover unless there is an injury that required it.

## 2016-12-18 NOTE — Telephone Encounter (Signed)
Patient returned your call and would like a call back.

## 2016-12-18 NOTE — Telephone Encounter (Signed)
Called patient and advised her of information below. Patient said she would call them back Monday.

## 2017-05-26 ENCOUNTER — Telehealth: Payer: Self-pay | Admitting: Endocrinology

## 2017-05-27 NOTE — Telephone Encounter (Signed)
I have sent in a 15 tablet refill for both of these medications with a note for pharmacy that patient needs an appointment. Please schedule patient for appointment per Dr. Kirtland BouchardK.

## 2017-05-27 NOTE — Telephone Encounter (Signed)
Give only 15 tablets with note to make appointment

## 2017-05-27 NOTE — Telephone Encounter (Signed)
Please advise if okay to refill. Last OV was 08/2016

## 2022-01-15 NOTE — Progress Notes (Signed)
Letter not mailed to patient.   LOV with PCP greater than 3 years ago.

## 2024-06-08 NOTE — Progress Notes (Signed)
 This encounter was created in error - please disregard.
